# Patient Record
Sex: Female | Born: 1995 | Hispanic: No | Marital: Single | Smoking: Former smoker
Health system: Southern US, Community
[De-identification: ages and names within clinical notes are randomized; demographics above are authoritative.]

## PROBLEM LIST (undated history)

## (undated) DIAGNOSIS — F419 Anxiety disorder, unspecified: Secondary | ICD-10-CM

## (undated) DIAGNOSIS — F4310 Post-traumatic stress disorder, unspecified: Secondary | ICD-10-CM

## (undated) DIAGNOSIS — F32A Depression, unspecified: Secondary | ICD-10-CM

## (undated) DIAGNOSIS — F329 Major depressive disorder, single episode, unspecified: Secondary | ICD-10-CM

## (undated) DIAGNOSIS — N200 Calculus of kidney: Secondary | ICD-10-CM

## (undated) HISTORY — PX: WISDOM TOOTH EXTRACTION: SHX21

## (undated) HISTORY — PX: KNEE SURGERY: SHX244

## (undated) HISTORY — DX: Calculus of kidney: N20.0

---
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
## 2015-01-24 ENCOUNTER — Ambulatory Visit: Payer: Self-pay | Admitting: Emergency Medicine

## 2015-07-14 ENCOUNTER — Encounter: Payer: Self-pay | Admitting: Emergency Medicine

## 2015-07-14 ENCOUNTER — Emergency Department
Admission: EM | Admit: 2015-07-14 | Discharge: 2015-07-14 | Disposition: A | Discharge status: Home / Self Care | Payer: Self-pay | Attending: Emergency Medicine | Admitting: Emergency Medicine

## 2015-07-14 DIAGNOSIS — Z720 Tobacco use: Secondary | ICD-10-CM | POA: Insufficient documentation

## 2015-07-14 DIAGNOSIS — L0231 Cutaneous abscess of buttock: Secondary | ICD-10-CM | POA: Insufficient documentation

## 2015-07-14 HISTORY — DX: Anxiety disorder, unspecified: F41.9

## 2015-07-14 MED ORDER — KETOROLAC TROMETHAMINE 60 MG/2ML IM SOLN
60.0000 mg | Freq: Once | INTRAMUSCULAR | Status: AC
  Administered 2015-07-14: 60 mg via INTRAMUSCULAR
  Filled 2015-07-14: qty 2

## 2015-07-14 MED ORDER — SULFAMETHOXAZOLE-TRIMETHOPRIM 800-160 MG PO TABS: 1.0000 | ORAL_TABLET | Freq: Two times a day (BID) | ORAL | Status: DC

## 2015-07-14 MED ORDER — OXYCODONE-ACETAMINOPHEN 5-325 MG PO TABS: 1.0000 | ORAL_TABLET | ORAL | Status: DC | PRN

## 2015-07-14 MED ORDER — PROMETHAZINE HCL 25 MG/ML IJ SOLN
12.5000 mg | Freq: Once | INTRAMUSCULAR | Status: AC
  Administered 2015-07-14: 12.5 mg via INTRAMUSCULAR
  Filled 2015-07-14: qty 1

## 2015-07-14 NOTE — ED Notes (Signed)
Pt to ED with c/o abcess to right buttocks x 2 days

## 2015-07-14 NOTE — ED Notes (Signed)
Left buttock abscess.

## 2015-07-14 NOTE — Discharge Instructions (Signed)
Abscess  An abscess is an infected area that contains a collection of pus and debris. It can occur in almost any part of the body. An abscess is also known as a furuncle or boil.  CAUSES   An abscess occurs when tissue gets infected. This can occur from blockage of oil or sweat glands, infection of hair follicles, or a minor injury to the skin. As the body tries to fight the infection, pus collects in the area and creates pressure under the skin. This pressure causes pain. People with weakened immune systems have difficulty fighting infections and get certain abscesses more often.   SYMPTOMS  Usually an abscess develops on the skin and becomes a painful mass that is red, warm, and tender. If the abscess forms under the skin, you may feel a moveable soft area under the skin. Some abscesses break open (rupture) on their own, but most will continue to get worse without care. The infection can spread deeper into the body and eventually into the bloodstream, causing you to feel ill.   DIAGNOSIS   Your caregiver will take your medical history and perform a physical exam. A sample of fluid may also be taken from the abscess to determine what is causing your infection.  TREATMENT   Your caregiver may prescribe antibiotic medicines to fight the infection. However, taking antibiotics alone usually does not cure an abscess. Your caregiver may need to make a small cut (incision) in the abscess to drain the pus. In some cases, gauze is packed into the abscess to reduce pain and to continue draining the area.  HOME CARE INSTRUCTIONS   · Only take over-the-counter or prescription medicines for pain, discomfort, or fever as directed by your caregiver.  · If you were prescribed antibiotics, take them as directed. Finish them even if you start to feel better.  · If gauze is used, follow your caregiver's directions for changing the gauze.  · To avoid spreading the infection:  ¨ Keep your draining abscess covered with a  bandage.  ¨ Wash your hands well.  ¨ Do not share personal care items, towels, or whirlpools with others.  ¨ Avoid skin contact with others.  · Keep your skin and clothes clean around the abscess.  · Keep all follow-up appointments as directed by your caregiver.  SEEK MEDICAL CARE IF:   · You have increased pain, swelling, redness, fluid drainage, or bleeding.  · You have muscle aches, chills, or a general ill feeling.  · You have a fever.  MAKE SURE YOU:   · Understand these instructions.  · Will watch your condition.  · Will get help right away if you are not doing well or get worse.  Document Released: 08/02/2005 Document Revised: 04/23/2012 Document Reviewed: 01/05/2012  ExitCare® Patient Information ©2015 [employer]. This information is not intended to replace advice given to you by your health care provider. Make sure you discuss any questions you have with your health care provider.  MRSA Infection  MRSA stands for methicillin-resistant Staphylococcus aureus. This type of infection is caused by Staphylococcus aureus bacteria that are no longer affected by the medicines used to kill them (drug resistant). Staphylococcus (staph) bacteria are normally found on the skin or in the nose of healthy people. In most cases, these bacteria do not cause infection. But if these resistant bacteria enter your body through a cut or sore, they can cause a serious infection on your skin or in other parts of your body. There is   a slight chance that the staph on your skin or in your nose is MRSA.  There are two types of MRSA infections:  · Hospital-acquired MRSA is bacteria that you get in the hospital.  · Community-acquired MRSA is bacteria that you get somewhere other than in a hospital.  RISK FACTORS  Hospital-acquired MRSA is more common. You could be at risk for this infection if you are in the hospital and you:  · Have surgery or a procedure.  · Have an IV access or a catheter tube placed in your body.  · Have weak  resistance to germs (weakened immune system).  · Are elderly.  · Are on kidney dialysis.  You could be at risk for community-acquired MRSA if you have a break in your skin and come into contact with MRSA. This may happen if you:  · Play sports where there is skin-to-skin contact.  · Live in a crowded setting, like a dormitory or a military barracks.  · Share towels, razors, or sports equipment with other people.  SYMPTOMS   Symptoms of hospital-acquired MRSA depend on where MRSA has spread. Symptoms may include:  · Wound infection.  · Skin infection.  · Rash.  · Pneumonia.  · Fever and chills.  · Difficulty breathing.  · Chest pain.   Community-acquired MRSA is most likely to start as a scratch or cut that becomes infected. Symptoms may include:  · A pus-filled pimple.  · A boil on your skin.  · Pus draining from your skin.  · A sore (abscess) under your skin or somewhere in your body.  · Fever with or without chills.  DIAGNOSIS   The diagnosis of MRSA is made by taking a sample from an infected area and sending it to a lab for testing. A lab technician can grow (culture) MRSA and check it under a microscope. The cultured MRSA can be tested to see which type of antibiotic medicine will work to treat it. Newer tests can identify MRSA more quickly by testing bacteria samples for MRSA genes. Your health care provider can diagnose MRSA using samples from:   · Cuts or wounds in infected areas.  · Nasal swabs.  · Saliva or cough specimens from deep in the lungs (sputum).  · Urine.  · Blood.  You may also have:  · Imaging studies (such as X-ray or MRI) to check if the infection has spread to the lungs, bones, or joints.  · A culture and sensitivity test of blood or fluids from inside the joints.  TREATMENT   Treatment depends on how severe, deep, or extensive the infection is. Very bad infections may require a hospital stay.  · Some skin infections, such as a small boil or sore (abscess), may be treated by draining pus  from the site of the infection.  · More extensive surgery to drain pus may be necessary for deeper or more widespread soft tissue infections.  · You may then have to take antibiotic medicine given by mouth or through a vein. You may start antibiotic treatment right away or after testing can be done to see what antibiotic medicine should be used.  HOME CARE INSTRUCTIONS   · Take your antibiotics as directed by your health care provider. Take the medicine as prescribed until it is finished.  · Avoid close contact with those around you as much as possible. Do not use towels, razors, toothbrushes, bedding, or other items that will be used by others.  · Wash your   hands frequently for 15 seconds with soap and water. Dry your hands with a clean or disposable towel.  · When you are not able to wash your hands, use hand sanitizer that is more than 60 percent alcohol.  · Wash towels, sheets, or clothes in the washing machine with detergent and hot water. Dry them in a hot dryer.  · Follow your health care provider's instructions for wound care. Wash your hands before and after changing your bandages.  · Always shower after exercising.  · Keep all cuts and scrapes clean and covered with a bandage.  · Be sure to tell all your health care providers that you have MRSA so they are aware of your infection.  SEEK MEDICAL CARE IF:  · You have a cut, scrape, pimple, or boil that becomes red, swollen, or painful or has pus in it.  · You have pus draining from your skin.  · You have an abscess under your skin or somewhere in your body.  SEEK IMMEDIATE MEDICAL CARE IF:   · You have symptoms of a skin infection with a fever or chills.  · You have trouble breathing.  · You have chest pain.  · You have a skin wound and you become nauseous or start vomiting.  MAKE SURE YOU:  · Understand these instructions.  · Will watch your condition.  · Will get help right away if you are not doing well or get worse.  Document Released: 10/23/2005  Document Revised: 10/28/2013 Document Reviewed: 08/15/2013  ExitCare® Patient Information ©2015 [employer]. This information is not intended to replace advice given to you by your health care provider. Make sure you discuss any questions you have with your health care provider.

## 2015-07-14 NOTE — ED Provider Notes (Signed)
[facility] Emergency Department Provider Note  ____________________________________________  Time seen: Approximately 5:45 PM  I have reviewed the triage vital signs and the nursing notes.   HISTORY  Chief Complaint Recurrent Skin Infections    HPI Abigail Parker is a 19 y.o. female presents for evaluation of an abscess to her right buttocks 2 days. Patient states that she has a history of picking sores on the body secondary to anxiety issues and is concerned about infection. Past medical history significant for recent knee surgery tendon repair replacement.   Past Medical History  Diagnosis Date  . Anxiety     There are no active problems to display for this patient.   Past Surgical History  Procedure Laterality Date  . Knee surgery Left     Current Outpatient Rx  Name  Route  Sig  Dispense  Refill  . oxyCODONE-acetaminophen (ROXICET) 5-325 MG per tablet   Oral   Take 1-2 tablets by mouth every 4 (four) hours as needed for severe pain.   15 tablet   0   . sulfamethoxazole-trimethoprim (BACTRIM DS,SEPTRA DS) 800-160 MG per tablet   Oral   Take 1 tablet by mouth 2 (two) times daily.   20 tablet   0     Allergies Norco  No family history on file.  Social History Social History  Substance Use Topics  . Smoking status: Current Every Day Smoker  . Smokeless tobacco: None  . Alcohol Use: No    Review of Systems Constitutional: No fever/chills Eyes: No visual changes. ENT: No sore throat. Cardiovascular: Denies chest pain. Respiratory: Denies shortness of breath. Gastrointestinal: No abdominal pain.  No nausea, no vomiting.  No diarrhea.  No constipation. Genitourinary: Negative for dysuria. Musculoskeletal: Negative for back pain. Skin: Positive for cutaneous abscess to the right buttocks Neurological: Negative for headaches, focal weakness or numbness.  10-point ROS otherwise  negative.  ____________________________________________   PHYSICAL EXAM:  VITAL SIGNS: ED Triage Vitals  Enc Vitals Group     BP 07/14/15 1659 132/68 mmHg     Pulse Rate 07/14/15 1659 100     Resp 07/14/15 1659 18     Temp 07/14/15 1659 98.8 F (37.1 C)     Temp Source 07/14/15 1659 Oral     SpO2 07/14/15 1659 100 %     Weight 07/14/15 1659 171 lb (77.565 kg)     Height 07/14/15 1659  (1.753 m)     Head Cir --      Peak Flow --      Pain Score 07/14/15 1705 8     Pain Loc --      Pain Edu? --      Excl. in GC? --     Constitutional: Alert and oriented. Well appearing and in no acute distress. Eyes: Conjunctivae are normal. PERRL. EOMI. Head: Atraumatic. Nose: No congestion/rhinnorhea. Mouth/Throat: Mucous membranes are moist.  Oropharynx non-erythematous. Neck: No stridor.   Cardiovascular: Normal rate, regular rhythm. Grossly normal heart sounds.  Good peripheral circulation. Respiratory: Normal respiratory effort.  No retractions. Lungs CTAB. Gastrointestinal: Soft and nontender. No distention. No abdominal bruits. No CVA tenderness. Musculoskeletal: No lower extremity tenderness nor edema.  No joint effusions. Neurologic:  Normal speech and language. No gross focal neurologic deficits are appreciated. No gait instability. Skin:  Skin is warm, dry and intact. 2 cm area of erythema no fluctuance noted tender to palpation. Psychiatric: Mood and affect are normal. Speech and behavior are normal.  ____________________________________________   LABS (all labs ordered are listed, but only abnormal results are displayed)  Labs Reviewed - No data to display ____________________________________________     PROCEDURES  Procedure(s) performed: None  Critical Care performed: No  ____________________________________________   INITIAL IMPRESSION / ASSESSMENT AND PLAN / ED COURSE  Pertinent labs & imaging results that were available during my care of the patient  were reviewed by me and considered in my medical decision making (see chart for details).  Cutaneous abscess of the buttocks. Concerned about MRSA and will start empirically on Bactrim DS twice a day #20 and oxycodone as needed for pain. Patient given Toradol and Phenergan IM while in the ED. Patient voices no other emergency medical complaints at this time. ____________________________________________   FINAL CLINICAL IMPRESSION(S) / ED DIAGNOSES  Final diagnoses:  Cutaneous abscess of buttock      Evangeline Dakin, PA-C 07/14/15 1804  Darien Ramus, MD 07/14/15 2232

## 2016-04-05 ENCOUNTER — Ambulatory Visit
Admission: EM | Admit: 2016-04-05 | Discharge: 2016-04-05 | Disposition: A | Discharge status: Home / Self Care | Payer: BLUE CROSS/BLUE SHIELD | Attending: Family Medicine | Admitting: Family Medicine

## 2016-04-05 ENCOUNTER — Encounter: Payer: Self-pay | Admitting: Emergency Medicine

## 2016-04-05 DIAGNOSIS — L03114 Cellulitis of left upper limb: Secondary | ICD-10-CM | POA: Diagnosis not present

## 2016-04-05 DIAGNOSIS — L03115 Cellulitis of right lower limb: Secondary | ICD-10-CM

## 2016-04-05 MED ORDER — OXYCODONE-ACETAMINOPHEN 5-325 MG PO TABS: 1.0000 | ORAL_TABLET | Freq: Three times a day (TID) | ORAL | Status: DC | PRN

## 2016-04-05 MED ORDER — SULFAMETHOXAZOLE-TRIMETHOPRIM 800-160 MG PO TABS: 1.0000 | ORAL_TABLET | Freq: Two times a day (BID) | ORAL | Status: DC

## 2016-04-05 NOTE — Discharge Instructions (Signed)

## 2016-04-05 NOTE — ED Notes (Signed)
Patient reports history of MRSA.  Patient reports sore on her left shoulder a week ago and right buttock for 2 days.  Patient reports to picking at her sores due to anxiety.  Patient denies fevers.

## 2016-05-04 ENCOUNTER — Emergency Department: Payer: BLUE CROSS/BLUE SHIELD

## 2016-05-04 ENCOUNTER — Emergency Department
Admission: EM | Admit: 2016-05-04 | Discharge: 2016-05-05 | Disposition: A | Discharge status: Home / Self Care | Payer: BLUE CROSS/BLUE SHIELD | Attending: Emergency Medicine | Admitting: Emergency Medicine

## 2016-05-04 ENCOUNTER — Ambulatory Visit
Admission: EM | Admit: 2016-05-04 | Discharge: 2016-05-04 | Disposition: A | Discharge status: Other Acute Inpatient Hospital | Payer: BLUE CROSS/BLUE SHIELD | Attending: Family Medicine | Admitting: Family Medicine

## 2016-05-04 ENCOUNTER — Encounter: Payer: Self-pay | Admitting: *Deleted

## 2016-05-04 DIAGNOSIS — N200 Calculus of kidney: Secondary | ICD-10-CM | POA: Diagnosis not present

## 2016-05-04 DIAGNOSIS — F329 Major depressive disorder, single episode, unspecified: Secondary | ICD-10-CM | POA: Insufficient documentation

## 2016-05-04 DIAGNOSIS — F17200 Nicotine dependence, unspecified, uncomplicated: Secondary | ICD-10-CM | POA: Insufficient documentation

## 2016-05-04 DIAGNOSIS — R109 Unspecified abdominal pain: Secondary | ICD-10-CM | POA: Diagnosis present

## 2016-05-04 DIAGNOSIS — Z79899 Other long term (current) drug therapy: Secondary | ICD-10-CM | POA: Insufficient documentation

## 2016-05-04 DIAGNOSIS — R1012 Left upper quadrant pain: Secondary | ICD-10-CM

## 2016-05-04 DIAGNOSIS — Z87442 Personal history of urinary calculi: Secondary | ICD-10-CM | POA: Diagnosis not present

## 2016-05-04 DIAGNOSIS — R319 Hematuria, unspecified: Secondary | ICD-10-CM

## 2016-05-04 DIAGNOSIS — J069 Acute upper respiratory infection, unspecified: Secondary | ICD-10-CM | POA: Diagnosis not present

## 2016-05-04 HISTORY — DX: Major depressive disorder, single episode, unspecified: F32.9

## 2016-05-04 HISTORY — DX: Post-traumatic stress disorder, unspecified: F43.10

## 2016-05-04 HISTORY — DX: Depression, unspecified: F32.A

## 2016-05-04 LAB — URINALYSIS COMPLETE WITH MICROSCOPIC (ARMC ONLY)
Bacteria, UA: NONE SEEN
Bilirubin Urine: NEGATIVE
GLUCOSE, UA: NEGATIVE mg/dL
Glucose, UA: NEGATIVE mg/dL
Ketones, ur: NEGATIVE mg/dL
LEUKOCYTES UA: NEGATIVE
NITRITE: NEGATIVE
Nitrite: NEGATIVE
PH: 7 (ref 5.0–8.0)
PROTEIN: 100 mg/dL — AB
PROTEIN: 100 mg/dL — AB
SPECIFIC GRAVITY, URINE: 1.02 (ref 1.005–1.030)
Specific Gravity, Urine: 1.018 (ref 1.005–1.030)
pH: 7 (ref 5.0–8.0)

## 2016-05-04 LAB — BASIC METABOLIC PANEL
Anion gap: 7 (ref 5–15)
BUN: 10 mg/dL (ref 6–20)
CHLORIDE: 105 mmol/L (ref 101–111)
CO2: 24 mmol/L (ref 22–32)
CREATININE: 0.64 mg/dL (ref 0.44–1.00)
Calcium: 9.3 mg/dL (ref 8.9–10.3)
GFR calc Af Amer: >60 mL/min (ref >60)
GLUCOSE: 89 mg/dL (ref 65–99)
POTASSIUM: 3.4 mmol/L — AB (ref 3.5–5.1)
Sodium: 136 mmol/L (ref 135–145)

## 2016-05-04 LAB — CBC
HCT: 39.3 % (ref 35.0–47.0)
Hemoglobin: 13.7 g/dL (ref 12.0–16.0)
MCH: 32.1 pg (ref 26.0–34.0)
MCHC: 34.8 g/dL (ref 32.0–36.0)
MCV: 92.2 fL (ref 80.0–100.0)
PLATELETS: 211 10*3/uL (ref 150–440)
RBC: 4.26 MIL/uL (ref 3.80–5.20)
RDW: 13.1 % (ref 11.5–14.5)
WBC: 10.3 10*3/uL (ref 3.6–11.0)

## 2016-05-04 LAB — POCT PREGNANCY, URINE: PREG TEST UR: NEGATIVE

## 2016-05-04 LAB — RAPID STREP SCREEN (MED CTR MEBANE ONLY): Streptococcus, Group A Screen (Direct): NEGATIVE

## 2016-05-04 MED ORDER — FENTANYL CITRATE (PF) 100 MCG/2ML IJ SOLN
50.0000 ug | Freq: Once | INTRAMUSCULAR | Status: AC
  Administered 2016-05-04: 50 ug via INTRAVENOUS

## 2016-05-04 MED ORDER — KETOROLAC TROMETHAMINE 60 MG/2ML IM SOLN
60.0000 mg | Freq: Once | INTRAMUSCULAR | Status: AC
  Administered 2016-05-04: 60 mg via INTRAMUSCULAR

## 2016-05-04 MED ORDER — TAMSULOSIN HCL 0.4 MG PO CAPS: 0.4000 mg | ORAL_CAPSULE | Freq: Every day | ORAL | Status: DC

## 2016-05-04 MED ORDER — ONDANSETRON HCL 4 MG/2ML IJ SOLN
4.0000 mg | Freq: Once | INTRAMUSCULAR | Status: AC
  Administered 2016-05-04: 4 mg via INTRAVENOUS
  Filled 2016-05-04: qty 2

## 2016-05-04 MED ORDER — OXYCODONE HCL 5 MG PO TABS: 5.0000 mg | ORAL_TABLET | Freq: Three times a day (TID) | ORAL | Status: AC | PRN

## 2016-05-04 MED ORDER — SODIUM CHLORIDE 0.9 % IV BOLUS (SEPSIS)
1000.0000 mL | Freq: Once | INTRAVENOUS | Status: AC
  Administered 2016-05-04: 1000 mL via INTRAVENOUS

## 2016-05-04 MED ORDER — FENTANYL CITRATE (PF) 100 MCG/2ML IJ SOLN
INTRAMUSCULAR | Status: AC
  Administered 2016-05-04: 50 ug via INTRAVENOUS
  Filled 2016-05-04: qty 2

## 2016-05-04 NOTE — ED Provider Notes (Addendum)
[facility] Emergency Department Provider Note   ____________________________________________  Time seen: Approximately 1010PM  I have reviewed the triage vital signs and the nursing notes.   HISTORY  Chief Complaint Flank Pain   HPI Abigail Parker is a 20 y.o. female with a history of kidney stones is presenting with left flank pain over the past 3 days which is worsening. She describes the pain as sharp and radiating around to the front of her abdomen. She denies any nausea vomiting or diarrhea. Was seen in urgent care earlier tonight and given 60 mg of Toradol but says that did not relieve her pain. She says she has never needed a surgical procedure to remove the stones in the past. Does not have a urologist.Has noticed blood in her urine.   Past Medical History  Diagnosis Date  . Anxiety   . Depression   . PTSD (post-traumatic stress disorder)     There are no active problems to display for this patient.   Past Surgical History  Procedure Laterality Date  . Knee surgery Left     Current Outpatient Rx  Name  Route  Sig  Dispense  Refill  . clonazePAM (KLONOPIN) 1 MG tablet   Oral   Take 1 mg by mouth 3 (three) times daily as needed for anxiety.         . divalproex (DEPAKOTE) 250 MG DR tablet   Oral   Take 250 mg by mouth 2 (two) times daily.         . DULoxetine (CYMBALTA) 60 MG capsule   Oral   Take 60 mg by mouth daily.           Allergies Norco  Family History  Problem Relation Age of Onset  . Hypertension Mother   . Depression Mother   . Anxiety disorder Mother     Social History Social History  Substance Use Topics  . Smoking status: Current Some Day Smoker  . Smokeless tobacco: Never Used  . Alcohol Use: No    Review of Systems Constitutional: No fever/chills Eyes: No visual changes. ENT: No sore throat. Cardiovascular: Denies chest pain. Respiratory: Denies shortness of breath. Gastrointestinal:    No nausea, no vomiting.  No diarrhea.  No constipation. Genitourinary: Negative for dysuria. Musculoskeletal: Left-sided flank and back pain. Skin: Negative for rash. Neurological: Negative for headaches, focal weakness or numbness.  10-point ROS otherwise negative.  ____________________________________________   PHYSICAL EXAM:  VITAL SIGNS: ED Triage Vitals  Enc Vitals Group     BP 05/04/16 2047 145/75 mmHg     Pulse Rate 05/04/16 2047 63     Resp 05/04/16 2047 18     Temp 05/04/16 2047 98.5 F (36.9 C)     Temp Source 05/04/16 2047 Oral     SpO2 05/04/16 2047 100 %     Weight 05/04/16 2047 180 lb (81.647 kg)     Height 05/04/16 2047 5\' 9"  (1.753 m)     Head Cir --      Peak Flow --      Pain Score 05/04/16 2048 9     Pain Loc --      Pain Edu? --      Excl. in GC? --     Constitutional: Alert and oriented. in no acute distress.Lying on her right side in the bed. Eyes: Conjunctivae are normal. PERRL. EOMI. Head: Atraumatic. Nose: No congestion/rhinnorhea. Mouth/Throat: Mucous membranes are moist.   Neck: No stridor.  Cardiovascular: Normal rate, regular rhythm. Grossly normal heart sounds.  Respiratory: Normal respiratory effort.  No retractions. Lungs CTAB. Gastrointestinal: Soft with moderate tenderness to the left abdomen lateral to the umbilicus. No rebound or guarding. No distention.   Becomes tearful upon palpation. Also positive for left-sided CVA tenderness palpation. Musculoskeletal: No lower extremity tenderness nor edema.  No joint effusions. Neurologic:  Normal speech and language. No gross focal neurologic deficits are appreciated. No gait instability. Skin:  Skin is warm, dry and intact. No rash noted. Psychiatric: Mood and affect are normal. Speech and behavior are normal.  ____________________________________________   LABS (all labs ordered are listed, but only abnormal results are displayed)  Labs Reviewed  BASIC METABOLIC PANEL - Abnormal;  Notable for the following:    Potassium 3.4 (*)    All other components within normal limits  URINALYSIS COMPLETEWITH MICROSCOPIC ([facility] ONLY) - Abnormal; Notable for the following:    Color, Urine RED (*)    APPearance TURBID (*)    Hgb urine dipstick 3+ (*)    Protein, ur 100 (*)    Leukocytes, UA 2+ (*)    Bacteria, UA MANY (*)    Squamous Epithelial / LPF TOO NUMEROUS TO COUNT (*)    All other components within normal limits  CBC  POC URINE PREG, ED  POCT PREGNANCY, URINE   ____________________________________________  EKG   ____________________________________________  RADIOLOGY   ____________________________________________   PROCEDURES   ____________________________________________   INITIAL IMPRESSION / ASSESSMENT AND PLAN / ED COURSE  Pertinent labs & imaging results that were available during my care of the patient were reviewed by me and considered in my medical decision making (see chart for details).  Urine shows contamination here with squamous epithelial cells. However earlier in the evening the patient had another urinalysis done at the urgent care which showed no leukocyte esterase or white blood cells or nitrite. Was positive for blood. Do not think that the patient has an infected urine.  ----------------------------------------- 11:17 PM on 05/04/2016 -----------------------------------------  Signed out to Dr. Manson PasseyBrown. Pending ultrasound results. ____________________________________________   FINAL CLINICAL IMPRESSION(S) / ED DIAGNOSES  Kidney stone.    NEW MEDICATIONS STARTED DURING THIS VISIT:  New Prescriptions   No medications on file     Note:  This document was prepared using Dragon voice recognition software and may include unintentional dictation errors.    Myrna Blazeravid Matthew Brantley Naser, MD 05/04/16 2317  Patient is a Norco allergy but says she has tolerated oxycodone in the past.  Myrna Blazeravid Matthew Jiali Linney, MD 05/04/16  2318

## 2016-05-04 NOTE — Discharge Instructions (Signed)
Cool Mist Vaporizers Vaporizers may help relieve the symptoms of a cough and cold. They add moisture to the air, which helps mucus to become thinner and less sticky. This makes it easier to breathe and cough up secretions. Cool mist vaporizers do not cause serious burns like hot mist vaporizers, which may also be called steamers or humidifiers. Vaporizers have not been proven to help with colds. You should not use a vaporizer if you are allergic to mold. HOME CARE INSTRUCTIONS  Follow the package instructions for the vaporizer.  Do not use anything other than distilled water in the vaporizer.  Do not run the vaporizer all of the time. This can cause mold or bacteria to grow in the vaporizer.  Clean the vaporizer after each time it is used.  Clean and dry the vaporizer well before storing it.  Stop using the vaporizer if worsening respiratory symptoms develop.   This information is not intended to replace advice given to you by your health care provider. Make sure you discuss any questions you have with your health care provider.   Document Released: 07/20/2004 Document Revised: 10/28/2013 Document Reviewed: 03/12/2013 Elsevier Interactive Patient Education 2016 Elsevier Inc.  Flank Pain Flank pain refers to pain that is located on the side of the body between the upper abdomen and the back. The pain may occur over a short period of time (acute) or may be long-term or reoccurring (chronic). It may be mild or severe. Flank pain can be caused by many things. CAUSES  Some of the more common causes of flank pain include:  Muscle strains.   Muscle spasms.   A disease of your spine (vertebral disk disease).   A lung infection (pneumonia).   Fluid around your lungs (pulmonary edema).   A kidney infection.   Kidney stones.   A very painful skin rash caused by the chickenpox virus (shingles).   Gallbladder disease.  HOME CARE INSTRUCTIONS  Home care will depend on the  cause of your pain. In general,  Rest as directed by your caregiver.  Drink enough fluids to keep your urine clear or pale yellow.  Only take over-the-counter or prescription medicines as directed by your caregiver. Some medicines may help relieve the pain.  Tell your caregiver about any changes in your pain.  Follow up with your caregiver as directed. SEEK IMMEDIATE MEDICAL CARE IF:   Your pain is not controlled with medicine.   You have new or worsening symptoms.  Your pain increases.   You have abdominal pain.   You have shortness of breath.   You have persistent nausea or vomiting.   You have swelling in your abdomen.   You feel faint or pass out.   You have blood in your urine.  You have a fever or persistent symptoms for more than 2-3 days.  You have a fever and your symptoms suddenly get worse. MAKE SURE YOU:   Understand these instructions.  Will watch your condition.  Will get help right away if you are not doing well or get worse.   This information is not intended to replace advice given to you by your health care provider. Make sure you discuss any questions you have with your health care provider.   Document Released: 12/14/2005 Document Revised: 07/17/2012 Document Reviewed: 06/06/2012 Elsevier Interactive Patient Education 2016 Elsevier Inc.  Upper Respiratory Infection, Adult Most upper respiratory infections (URIs) are a viral infection of the air passages leading to the lungs. A URI affects the  nose, throat, and upper air passages. The most common type of URI is nasopharyngitis and is typically referred to as "the common cold." URIs run their course and usually go away on their own. Most of the time, a URI does not require medical attention, but sometimes a bacterial infection in the upper airways can follow a viral infection. This is called a secondary infection. Sinus and middle ear infections are common types of secondary upper respiratory  infections. Bacterial pneumonia can also complicate a URI. A URI can worsen asthma and chronic obstructive pulmonary disease (COPD). Sometimes, these complications can require emergency medical care and may be life threatening.  CAUSES Almost all URIs are caused by viruses. A virus is a type of germ and can spread from one person to another.  RISKS FACTORS You may be at risk for a URI if:   You smoke.   You have chronic heart or lung disease.  You have a weakened defense (immune) system.   You are very young or very old.   You have nasal allergies or asthma.  You work in crowded or poorly ventilated areas.  You work in health care facilities or schools. SIGNS AND SYMPTOMS  Symptoms typically develop 2-3 days after you come in contact with a cold virus. Most viral URIs last 7-10 days. However, viral URIs from the influenza virus (flu virus) can last 14-18 days and are typically more severe. Symptoms may include:   Runny or stuffy (congested) nose.   Sneezing.   Cough.   Sore throat.   Headache.   Fatigue.   Fever.   Loss of appetite.   Pain in your forehead, behind your eyes, and over your cheekbones (sinus pain).  Muscle aches.  DIAGNOSIS  Your health care provider may diagnose a URI by:  Physical exam.  Tests to check that your symptoms are not due to another condition such as:  Strep throat.  Sinusitis.  Pneumonia.  Asthma. TREATMENT  A URI goes away on its own with time. It cannot be cured with medicines, but medicines may be prescribed or recommended to relieve symptoms. Medicines may help:  Reduce your fever.  Reduce your cough.  Relieve nasal congestion. HOME CARE INSTRUCTIONS   Take medicines only as directed by your health care provider.   Gargle warm saltwater or take cough drops to comfort your throat as directed by your health care provider.  Use a warm mist humidifier or inhale steam from a shower to increase air moisture.  This may make it easier to breathe.  Drink enough fluid to keep your urine clear or pale yellow.   Eat soups and other clear broths and maintain good nutrition.   Rest as needed.   Return to work when your temperature has returned to normal or as your health care provider advises. You may need to stay home longer to avoid infecting others. You can also use a face mask and careful hand washing to prevent spread of the virus.  Increase the usage of your inhaler if you have asthma.   Do not use any tobacco products, including cigarettes, chewing tobacco, or electronic cigarettes. If you need help quitting, ask your health care provider. PREVENTION  The best way to protect yourself from getting a cold is to practice good hygiene.   Avoid oral or hand contact with people with cold symptoms.   Wash your hands often if contact occurs.  There is no clear evidence that vitamin C, vitamin E, echinacea, or exercise  reduces the chance of developing a cold. However, it is always recommended to get plenty of rest, exercise, and practice good nutrition.  SEEK MEDICAL CARE IF:   You are getting worse rather than better.   Your symptoms are not controlled by medicine.   You have chills.  You have worsening shortness of breath.  You have brown or red mucus.  You have yellow or brown nasal discharge.  You have pain in your face, especially when you bend forward.  You have a fever.  You have swollen neck glands.  You have pain while swallowing.  You have white areas in the back of your throat. SEEK IMMEDIATE MEDICAL CARE IF:   You have severe or persistent:  Headache.  Ear pain.  Sinus pain.  Chest pain.  You have chronic lung disease and any of the following:  Wheezing.  Prolonged cough.  Coughing up blood.  A change in your usual mucus.  You have a stiff neck.  You have changes in your:  Vision.  Hearing.  Thinking.  Mood. MAKE SURE YOU:    Understand these instructions.  Will watch your condition.  Will get help right away if you are not doing well or get worse.   This information is not intended to replace advice given to you by your health care provider. Make sure you discuss any questions you have with your health care provider.   Document Released: 04/18/2001 Document Revised: 03/09/2015 Document Reviewed: 01/28/2014 Elsevier Interactive Patient Education [employer]2016 Elsevier Inc.

## 2016-05-04 NOTE — ED Notes (Signed)
Patient started having left flank pain 3 days ago and nasal congestion with cough 2 days ago. Patient states that she is peeing blood. She reports having a history of kidney stones.

## 2016-05-04 NOTE — ED Notes (Signed)
Pt in with co left flank pain x 3 days states has had bloody urine and foul smelling urine. [facility] and sent here due to blood in urine sample.

## 2016-05-04 NOTE — ED Notes (Signed)
Pt in via triage with complaints of intermittent left flank pain x 3 days.  Pt reports being seen at [facility] and sent here due to blood in her urine.  Pt reports urgency as well as difficulty with urination at times.  Pt A/Ox4, in no immediate distress at this time.

## 2016-05-04 NOTE — ED Provider Notes (Signed)
CSN: [id number]     Arrival date & time 05/04/16  1842 History   First MD Initiated Contact with Patient 05/04/16 1922     Chief Complaint  Patient presents with  . Flank Pain   (Consider location/radiation/quality/duration/timing/severity/associated sxs/prior Treatment) HPI  20 year old female presents with left flank pain for 3 days and also nasal congestion with cough for 2 days. Has noticed red urine along with significant left flank pain. She has a previous history of kidney stones one that she was seen in the emergency room and a a second stone she passed spontaneously. She is not on her period has Marana IUD but will have occasional vaginal bleeding. She thinks that most of this bleeding is from the urethra. No fever or chills. Slight amount of whitish discharge vaginally.      Past Medical History  Diagnosis Date  . Anxiety   . Depression   . PTSD (post-traumatic stress disorder)    Past Surgical History  Procedure Laterality Date  . Knee surgery Left    Family History  Problem Relation Age of Onset  . Hypertension Mother   . Depression Mother   . Anxiety disorder Mother    Social History  Substance Use Topics  . Smoking status: Current Some Day Smoker  . Smokeless tobacco: Never Used  . Alcohol Use: No   OB History    No data available     Review of Systems  Constitutional: Positive for activity change. Negative for fever, chills and fatigue.  HENT: Positive for congestion, postnasal drip, sinus pressure and sore throat.   Respiratory: Positive for cough. Negative for shortness of breath, wheezing and stridor.   Genitourinary: Positive for dysuria, urgency, frequency, hematuria, flank pain and vaginal discharge.  All other systems reviewed and are negative.   Allergies  Norco  Home Medications   Prior to Admission medications   Medication Sig Start Date End Date Taking? Authorizing Provider  clonazePAM (KLONOPIN) 1 MG tablet Take 1 mg by mouth 3  (three) times daily as needed for anxiety.   Yes Historical Provider, MD  divalproex (DEPAKOTE) 250 MG DR tablet Take 250 mg by mouth 2 (two) times daily.   Yes Historical Provider, MD  DULoxetine (CYMBALTA) 60 MG capsule Take 60 mg by mouth daily.   Yes Historical Provider, MD   Meds Ordered and Administered this Visit   Medications  ketorolac (TORADOL) injection 60 mg (60 mg Intramuscular Given 05/04/16 1941)    BP 128/69 mmHg  Pulse 75  Temp(Src) 98.7 F (37.1 C) (Oral)  Resp 18  Ht 5' 9.75" (1.772 m)  Wt 180 lb (81.647 kg)  BMI 26.00 kg/m2  SpO2 100%  LMP 03/24/2016 No data found.   Physical Exam  Constitutional: She is oriented to person, place, and time. She appears well-developed and well-nourished. No distress.  HENT:  Head: Normocephalic and atraumatic.  Right Ear: External ear normal.  Left Ear: External ear normal.  Nose: Nose normal.  Mouth/Throat: Oropharynx is clear and moist. No oropharyngeal exudate.  Eyes: Conjunctivae are normal. Pupils are equal, round, and reactive to light. Right eye exhibits no discharge. Left eye exhibits no discharge.  Neck: Normal range of motion. Neck supple.  Pulmonary/Chest: Effort normal and breath sounds normal. No respiratory distress. She has no wheezes. She has no rales.  Abdominal: Soft. Bowel sounds are normal. She exhibits no distension. There is tenderness. There is no rebound and no guarding.  He has significant left CVA tenderness.  Musculoskeletal: Normal  range of motion. She exhibits no edema or tenderness.  Lymphadenopathy:    She has no cervical adenopathy.  Neurological: She is alert and oriented to person, place, and time.  Skin: Skin is warm and dry. She is not diaphoretic.  Psychiatric: She has a normal mood and affect. Her behavior is normal. Judgment and thought content normal.  Nursing note and vitals reviewed.   ED Course  Procedures (including critical care time)  Labs Review Labs Reviewed   URINALYSIS COMPLETEWITH MICROSCOPIC ([facility] ONLY) - Abnormal; Notable for the following:    Color, Urine RED (*)    APPearance CLOUDY (*)    Bilirubin Urine SMALL (*)    Ketones, ur TRACE (*)    Hgb urine dipstick LARGE (*)    Protein, ur 100 (*)    Squamous Epithelial / LPF 0-5 (*)    All other components within normal limits  RAPID STREP SCREEN (NOT AT [facility])  CULTURE, GROUP A STREP [facility]    Imaging Review No results found.   Visual Acuity Review  Right Eye Distance:   Left Eye Distance:   Bilateral Distance:    Right Eye Near:   Left Eye Near:    Bilateral Near:     Medications  ketorolac (TORADOL) injection 60 mg (60 mg Intramuscular Given 05/04/16 1941)      MDM   1. Acute URI   2. Acute left flank pain   3. History of kidney stones    Discharge Medication List as of 05/04/2016  7:54 PM    Plan: 1. Test/x-ray results and diagnosis reviewed with patient 2. rx as per orders; risks, benefits, potential side effects reviewed with patient 3. Recommend supportive treatment with Flonase for her nasal congestion and upper respiratory symptoms. Take lozenges or cough drops as necessary. Is is probably viral rash course.Because of her too numerous to count RBC her left flank pain and our not having a CAT scan available to us tonight recommended she be seen at the emergency room for further evaluation and treatment possibly a stone run to confirm a kidney stone. Is agreeable to this. I did provide her with Toradol injection because of the pain prior to her departure. [facility] She was stable at the time of her departure . 4. F/u prn if symptoms worsen or don't improve  Should be noted this patient has another chart from previous visits that are not appearing today. It is in the process of being merged. EM medical record number for the chart is [id number].   Lutricia FeilWilliam P Jojuan Champney, PA-C 05/04/16 2008

## 2016-05-05 ENCOUNTER — Emergency Department: Payer: BLUE CROSS/BLUE SHIELD

## 2016-05-05 MED ORDER — OXYCODONE-ACETAMINOPHEN 5-325 MG PO TABS: 1.0000 | ORAL_TABLET | Freq: Once | ORAL | Status: DC

## 2016-05-05 NOTE — ED Notes (Signed)
Pt discharged to home.  Friends driving.  Discharge instructions reviewed.  Verbalized understanding.  No questions or concerns at this time.  Teach back verified.  Pt in NAD.  No items left in ED.

## 2016-05-05 NOTE — ED Notes (Signed)
EDP aware of BP.  Pt states this is her normal BP.  EDP acknowledged.

## 2016-05-05 NOTE — ED Provider Notes (Signed)
I assumed care from Dr Langston MaskerShaevitz US/CT revealed:    CT Renal Stone Study (Final result) Result time: 05/05/16 01:48:12   Final result by Rad Results In Interface (05/05/16 01:48:12)   Narrative:   CLINICAL DATA: 20 year old female with abdominal pain and hematuria  EXAM: CT ABDOMEN AND PELVIS WITHOUT CONTRAST  TECHNIQUE: Multidetector CT imaging of the abdomen and pelvis was performed following the standard protocol without IV contrast.  COMPARISON: CT dated 08/27/2014 and renal ultrasound dated 05/04/2016  FINDINGS: Evaluation of this exam is limited in the absence of intravenous contrast.  The visualized lung bases are clear. No intra-abdominal free air or free fluid.  Stop mild fatty infiltration of the liver. The gallbladder, pancreas, spleen, adrenal glands, kidneys, visualized ureters, and urinary bladder appear unremarkable. The uterus is anteverted and grossly unremarkable. An intrauterine device is noted.  The ovaries are grossly unremarkable.  Constipation. No bowel obstruction or active inflammation. Normal appendix.  The abdominal aorta and IVC appear grossly unremarkable on this noncontrast study. No portal venous gas identified. There is no adenopathy. There is mild diffuse subcutaneous stranding. No fluid collection. The osseous structures are intact.  IMPRESSION: No acute intra-abdominal or pelvic pathology. Specifically there is no hydronephrosis or nephrolithiasis.  Constipation. No bowel obstruction. Normal appendix.  Mild fatty liver.   Electronically Signed By: Elgie CollardArash Radparvar M.D. On: 05/05/2016 01:48          US Renal (Final result) Result time: 05/05/16 00:53:54   Final result by Rad Results In Interface (05/05/16 00:53:54)   Narrative:   CLINICAL DATA: Left flank pain for 3 days  EXAM: RENAL / URINARY TRACT ULTRASOUND COMPLETE  COMPARISON: None.  FINDINGS: Right Kidney:  Length: 12.3 cm. Echogenicity  within normal limits. No mass or hydronephrosis visualized.  Left Kidney:  Length: 11.7 cm. Echogenicity within normal limits. No mass or hydronephrosis visualized.  Bladder:  Empty.  IMPRESSION: Normal kidneys. No hydronephrosis.   Electronically Signed By: Ellery Plunkaniel R Mitchell M.D. On: 05/05/2016 00:53        ECG Results      Darci Currentandolph N Serah Nicoletti, MD 05/05/16 [id number]

## 2016-05-05 NOTE — Discharge Instructions (Signed)

## 2016-05-07 LAB — CULTURE, GROUP A STREP (THRC)

## 2016-05-10 ENCOUNTER — Encounter: Payer: Self-pay | Admitting: *Deleted

## 2016-05-12 ENCOUNTER — Encounter: Payer: Self-pay | Admitting: Emergency Medicine

## 2016-05-12 ENCOUNTER — Ambulatory Visit (INDEPENDENT_AMBULATORY_CARE_PROVIDER_SITE_OTHER): Payer: BLUE CROSS/BLUE SHIELD

## 2016-05-12 ENCOUNTER — Ambulatory Visit
Admission: EM | Admit: 2016-05-12 | Discharge: 2016-05-12 | Disposition: A | Discharge status: Home / Self Care | Payer: BLUE CROSS/BLUE SHIELD | Attending: Family Medicine | Admitting: Family Medicine

## 2016-05-12 DIAGNOSIS — S62318A Displaced fracture of base of other metacarpal bone, initial encounter for closed fracture: Secondary | ICD-10-CM

## 2016-05-12 MED ORDER — OXYCODONE-ACETAMINOPHEN 5-325 MG PO TABS: 1.0000 | ORAL_TABLET | Freq: Three times a day (TID) | ORAL | Status: DC | PRN

## 2016-05-12 MED ORDER — KETOROLAC TROMETHAMINE 60 MG/2ML IM SOLN
60.0000 mg | Freq: Once | INTRAMUSCULAR | Status: AC
  Administered 2016-05-12: 60 mg via INTRAMUSCULAR

## 2016-05-12 MED ORDER — ONDANSETRON 8 MG PO TBDP
8.0000 mg | ORAL_TABLET | Freq: Once | ORAL | Status: AC
  Administered 2016-05-12: 8 mg via ORAL

## 2016-05-12 NOTE — ED Notes (Addendum)
Patient states that she tripped over a cord and she heard a pop in her left 5th finger.  Patient reports pain going up her right arm.

## 2016-05-12 NOTE — ED Provider Notes (Signed)
CSN: [id number]     Arrival date & time 05/12/16  1412 History   None    Chief Complaint  Patient presents with  . Finger Injury   (Consider location/radiation/quality/duration/timing/severity/associated sxs/prior Treatment) HPI Comments: 20 yo female presents with a complaint of left hand, forearm and upper arm pain after tripping over a cord and hitting a doorsill. States pain is worse over the left 5th finger.   The history is provided by the patient.    Past Medical History  Diagnosis Date  . Anxiety   . Depression   . PTSD (post-traumatic stress disorder)    Past Surgical History  Procedure Laterality Date  . Knee surgery Left    Family History  Problem Relation Age of Onset  . Hypertension Mother   . Depression Mother   . Anxiety disorder Mother    Social History  Substance Use Topics  . Smoking status: Current Some Day Smoker  . Smokeless tobacco: Never Used  . Alcohol Use: No   OB History    Gravida Para Term Preterm AB TAB SAB Ectopic Multiple Living   0 0 0 0 0 0 0 0       Review of Systems  Allergies  Norco and Norco  Home Medications   Prior to Admission medications   Medication Sig Start Date End Date Taking? Authorizing Provider  clonazePAM (KLONOPIN) 1 MG tablet Take 1 mg by mouth 2 (two) times daily as needed for anxiety.    Historical Provider, MD  clonazePAM (KLONOPIN) 1 MG tablet Take 1 mg by mouth 3 (three) times daily as needed for anxiety.    Historical Provider, MD  divalproex (DEPAKOTE) 125 MG DR tablet Take 125 mg by mouth 2 (two) times daily.    Historical Provider, MD  divalproex (DEPAKOTE) 250 MG DR tablet Take 250 mg by mouth 2 (two) times daily.    Historical Provider, MD  DULoxetine (CYMBALTA) 60 MG capsule Take 60 mg by mouth daily.    Historical Provider, MD  DULoxetine (CYMBALTA) 60 MG capsule Take 60 mg by mouth daily.    Historical Provider, MD  oxyCODONE (ROXICODONE) 5 MG immediate release tablet Take 1 tablet (5 mg total) by  mouth every 8 (eight) hours as needed. 05/04/16 05/04/17  Myrna Blazeravid Matthew Schaevitz, MD  oxyCODONE-acetaminophen (PERCOCET/ROXICET) 5-325 MG tablet Take 1-2 tablets by mouth every 8 (eight) hours as needed for severe pain. 05/12/16   Payton Mccallumrlando Takerra Lupinacci, MD  sulfamethoxazole-trimethoprim (BACTRIM DS,SEPTRA DS) 800-160 MG tablet Take 1 tablet by mouth 2 (two) times daily. 04/05/16   Payton Mccallumrlando Dontrell Stuck, MD  tamsulosin (FLOMAX) 0.4 MG CAPS capsule Take 1 capsule (0.4 mg total) by mouth daily. 05/04/16   Myrna Blazeravid Matthew Schaevitz, MD   Meds Ordered and Administered this Visit   Medications  ketorolac (TORADOL) injection 60 mg (60 mg Intramuscular Given 05/12/16 1451)  ondansetron (ZOFRAN-ODT) disintegrating tablet 8 mg (8 mg Oral Given 05/12/16 1450)    BP 136/67 mmHg  Pulse 67  Temp(Src) 98.1 F (36.7 C) (Oral)  Resp 16  Ht 5\' 9"  (1.753 m)  Wt 180 lb (81.647 kg)  BMI 26.57 kg/m2  SpO2 99%  LMP 03/24/2016 No data found.   Physical Exam  Constitutional: She appears well-developed and well-nourished. No distress.  Musculoskeletal:       Left hand: She exhibits bony tenderness (over 5th metacarpal). She exhibits normal range of motion, normal two-point discrimination, normal capillary refill, no deformity and no laceration. Normal sensation noted.  Tenderness to palpation  over left forearm and upper arm (humerus)  Skin: She is not diaphoretic.  Nursing note and vitals reviewed.   ED Course  Procedures (including critical care time)  Labs Review Labs Reviewed - No data to display  Imaging Review Dg Forearm Left  05/12/2016  CLINICAL DATA:  Pain following fall EXAM: LEFT FOREARM - 2 VIEW COMPARISON:  None. FINDINGS: Frontal and lateral views obtained. There is no fracture or dislocation. Joint spaces appear normal. No abnormal periosteal reaction. Incidental note is made of a minus ulnar variance. IMPRESSION: No fracture or dislocation. No apparent arthropathy. Minus ulnar variance. Electronically Signed    By: Bretta BangWilliam  Woodruff III M.D.   On: 05/12/2016 15:41   Dg Humerus Left  05/12/2016  CLINICAL DATA:  Pain following fall EXAM: LEFT HUMERUS - 2+ VIEW COMPARISON:  None. FINDINGS: Frontal and lateral views obtained. No fracture or dislocation. Joint spaces appear normal. No erosive change. No abnormal periosteal reaction. IMPRESSION: No fracture or dislocation.  No apparent arthropathy. Electronically Signed   By: Bretta BangWilliam  Woodruff III M.D.   On: 05/12/2016 15:41   Dg Hand Complete Left  05/12/2016  CLINICAL DATA:  Fall on wall with left hand pain, initial encounter EXAM: LEFT HAND - COMPLETE 3+ VIEW COMPARISON:  None. FINDINGS: There is an oblique fracture through the midshaft of the fifth metacarpal with mild impaction at the fracture site. No other fractures are seen. IMPRESSION: Fifth metacarpal fracture Electronically Signed   By: Alcide CleverMark  Lukens M.D.   On: 05/12/2016 15:42     Visual Acuity Review  Right Eye Distance:   Left Eye Distance:   Bilateral Distance:    Right Eye Near:   Left Eye Near:    Bilateral Near:         MDM   1. Fracture of fifth metacarpal bone, closed, initial encounter   (left)    Discharge Medication List as of 05/12/2016  4:06 PM     1. x-ray results and diagnosis reviewed with patient 2. Area immobilized with an ulnar gutter splint 3. rx as per orders above; reviewed possible side effects, interactions, risks and benefits;rx printed for percocet as per orders  4. Recommend supportive treatment with elevation 5. Follow-up with orthopedist within 1 week  Payton Mccallumrlando Cheril Slattery, MD 05/12/16 [phone]

## 2016-05-12 NOTE — Discharge Instructions (Signed)
Boxer's Fracture °A boxer's fracture is a break (fracture) of the bone in your hand that connects your little finger to your wrist (fifth metacarpal). This type of fracture usually happens at the end of the bone, closest to the little finger. The knuckle is often pushed down by the impact. °In some cases, only a splint or brace is needed, or you may need a cast. Casting or splinting may include taping your injured finger to the next finger (buddy taping). You may need surgery to repair the fracture. This may involve the use of wires, screws, or plates to hold the bone pieces in place.  °CAUSES °This injury may be caused by:  °· Hitting an object with a clenched fist. °· A hard, direct hit to the hand. °· An injury that crushes the hand. °RISK FACTORS °This injury is more likely to occur if: °· You are in a fistfight. °· You have certain bone diseases. °SYMPTOMS °Symptoms of this type of fracture develop soon after the injury. Symptoms may include: °· Swelling of the hand. °· Pain. °· Pain when moving the fifth finger or touching the hand. °· Abnormal position of the finger. °· Not being able to move the finger. °· A shortened finger. °· A finger knuckle that looks sunken in. °DIAGNOSIS °This injury may be diagnosed based on your symptoms, especially if you had a recent hand injury. Your health care provider will perform a physical exam, and you may also have X-rays to confirm the diagnosis. °TREATMENT  °Treatment for this injury depends on how severe it is. Possible treatments include: °· Closed reduction. If your bone is stable and can be moved back into place, you may only need to wear a cast or splint or have buddy taping. °· Open reduction with internal fixation (ORIF). This may be needed if your fracture is far out of place or goes through the joint surface of the bone. This treatment involves open surgery to move your bones back into the right position. Screws, wires, or plates may be used to stabilize the  fracture. °You may need to wear a cast or a splint for several weeks. You will also need to have follow-up X-rays to make sure that the bone is healing well and staying in position. After you no longer need the cast or splint, you may need physical therapy. This will help you to regain full movement and strength in your hand.  °HOME CARE INSTRUCTIONS °If You Have a Cast: °· Do not stick anything inside the cast to scratch your skin. Doing that increases your risk of infection. °· Check the skin around the cast every day. Report any concerns to your health care provider. You may put lotion on dry skin around the edges of the cast. Do not apply lotion to the skin underneath the cast. °If You Have a Splint: °· Wear it as directed by your health care provider. Remove it only as directed by your health care provider. °· Loosen the splint if your fingers become numb and tingle, or if they turn cold and blue. °Bathing °· Cover the cast or splint with a watertight plastic bag to protect it from water while you take a bath or a shower. Do not let the cast or splint get wet. °Managing Pain, Stiffness, and Swelling °· If directed, apply ice to the injured area (if you have a splint, not a cast): °¨ Put ice in a plastic bag. °¨ Place a towel between your skin and the bag. °¨   Leave the ice on for 20 minutes, 2-3 times a day. °· Move your fingers often to avoid stiffness and to lessen swelling. °· Raise the injured area above the level of your heart while you are sitting or lying down. °Driving °· Do not drive or operate heavy machinery while taking pain medicine. °· Do not drive while wearing a cast or splint on a hand or foot that you use for driving. °Activity °· Return to your normal activities as directed by your health care provider. Ask your health care provider what activities are safe for you. °General Instructions °· Do not put pressure on any part of the cast or splint until it is fully hardened. This may take several  hours. °· Keep the cast or splint clean and dry. °· Do not use any tobacco products, including cigarettes, chewing tobacco, or electronic cigarettes. Tobacco can delay bone healing. If you need help quitting, ask your health care provider. °· Take medicines only as directed by your health care provider. °· Keep all follow-up visits as directed by your health care provider. This is important. °SEEK MEDICAL CARE IF: °· Your pain is getting worse. °· You have redness, swelling, or pain in the injured area. °· You have fluid, blood, or pus coming from under your cast or splint. °· You notice a bad smell coming from under your cast or splint. °· You have a fever. °· Your cast or splint feels too tight or too loose. °· You cast is coming apart. °SEEK IMMEDIATE MEDICAL CARE IF: °· You develop a rash. °· You have trouble breathing. °· Your skin or nails on your injured hand turn blue or gray even after you loosen your splint. °· Your injured hand feels cold or becomes numb even after you loosen your splint. °· You develop severe pain under the cast or in your hand. °  °This information is not intended to replace advice given to you by your health care provider. Make sure you discuss any questions you have with your health care provider. °  °Document Released: 10/23/2005 Document Revised: 07/14/2015 Document Reviewed: 08/12/2014 °Elsevier Interactive Patient Education ©2016 [employer] ° °

## 2016-05-12 NOTE — ED Notes (Signed)
Patient shows no signs of adverse reaction to medication at this time.  

## 2016-05-27 NOTE — ED Provider Notes (Signed)
CSN: [id number]     Arrival date & time 04/05/16  1656 History   First MD Initiated Contact with Patient 04/05/16 1724     Chief Complaint  Patient presents with  . Abscess   (Consider location/radiation/quality/duration/timing/severity/associated sxs/prior Treatment) HPI Comments: 20 yo female with a c/o left shoulder and right buttock boils which are red, tender and have been draining some pus. Denies any fevers, chills.   Patient is a 20 y.o. female presenting with abscess. The history is provided by the patient.  Abscess   Past Medical History  Diagnosis Date  . Anxiety   . Depression   . PTSD (post-traumatic stress disorder)    Past Surgical History  Procedure Laterality Date  . Knee surgery Left    Family History  Problem Relation Age of Onset  . Hypertension Mother   . Depression Mother   . Anxiety disorder Mother    Social History  Substance Use Topics  . Smoking status: Current Some Day Smoker  . Smokeless tobacco: Never Used  . Alcohol Use: No   OB History    Gravida Para Term Preterm AB TAB SAB Ectopic Multiple Living   0 0 0 0 0 0 0 0       Review of Systems  Allergies  Norco and Norco  Home Medications   Prior to Admission medications   Medication Sig Start Date End Date Taking? Authorizing Provider  clonazePAM (KLONOPIN) 1 MG tablet Take 1 mg by mouth 2 (two) times daily as needed for anxiety.   Yes Historical Provider, MD  divalproex (DEPAKOTE) 125 MG DR tablet Take 125 mg by mouth 2 (two) times daily.   Yes Historical Provider, MD  DULoxetine (CYMBALTA) 60 MG capsule Take 60 mg by mouth daily.   Yes Historical Provider, MD  clonazePAM (KLONOPIN) 1 MG tablet Take 1 mg by mouth 3 (three) times daily as needed for anxiety.    Historical Provider, MD  divalproex (DEPAKOTE) 250 MG DR tablet Take 250 mg by mouth 2 (two) times daily.    Historical Provider, MD  DULoxetine (CYMBALTA) 60 MG capsule Take 60 mg by mouth daily.    Historical Provider, MD   oxyCODONE (ROXICODONE) 5 MG immediate release tablet Take 1 tablet (5 mg total) by mouth every 8 (eight) hours as needed. 05/04/16 05/04/17  Myrna Blazer, MD  oxyCODONE-acetaminophen (PERCOCET/ROXICET) 5-325 MG tablet Take 1-2 tablets by mouth every 8 (eight) hours as needed for severe pain. 05/12/16   Payton Mccallum, MD  sulfamethoxazole-trimethoprim (BACTRIM DS,SEPTRA DS) 800-160 MG tablet Take 1 tablet by mouth 2 (two) times daily. 04/05/16   Payton Mccallum, MD  tamsulosin (FLOMAX) 0.4 MG CAPS capsule Take 1 capsule (0.4 mg total) by mouth daily. 05/04/16   Myrna Blazer, MD   Meds Ordered and Administered this Visit  Medications - No data to display  BP 125/77 mmHg  Pulse 112  Temp(Src) 98.7 F (37.1 C) (Tympanic)  Resp 16  Ht 5\' 10"  (1.778 m)  Wt 175 lb (79.379 kg)  BMI 25.11 kg/m2  SpO2 100% No data found.   Physical Exam  Constitutional: She appears well-developed and well-nourished. No distress.  Skin: Rash noted. Rash is pustular. She is not diaphoretic. There is erythema.  Nursing note and vitals reviewed.   ED Course  Procedures (including critical care time)  Labs Review Labs Reviewed - No data to display  Imaging Review No results found.   Visual Acuity Review  Right Eye Distance:  Left Eye Distance:   Bilateral Distance:    Right Eye Near:   Left Eye Near:    Bilateral Near:         MDM   1. Cellulitis of right lower extremity   2. Cellulitis of left upper arm    Discharge Medication List as of 04/05/2016  6:05 PM     1.  diagnosis reviewed with patient 2. rx as per orders above; reviewed possible side effects, interactions, risks and benefits; rx for bactrim ds  3. Recommend supportive treatment with warm compresses, elevation 4. Follow-up prn if symptoms worsen or don't improve    Payton Mccallum, MD 05/27/16 1657

## 2016-09-11 IMAGING — CR DG FOREARM 2V*L*
2 series · 2 of 2 positions shown · non-contrast
Comparison: None.

CLINICAL DATA: Pain following fall

EXAM:
LEFT FOREARM - 2 VIEW

[forearm ap]
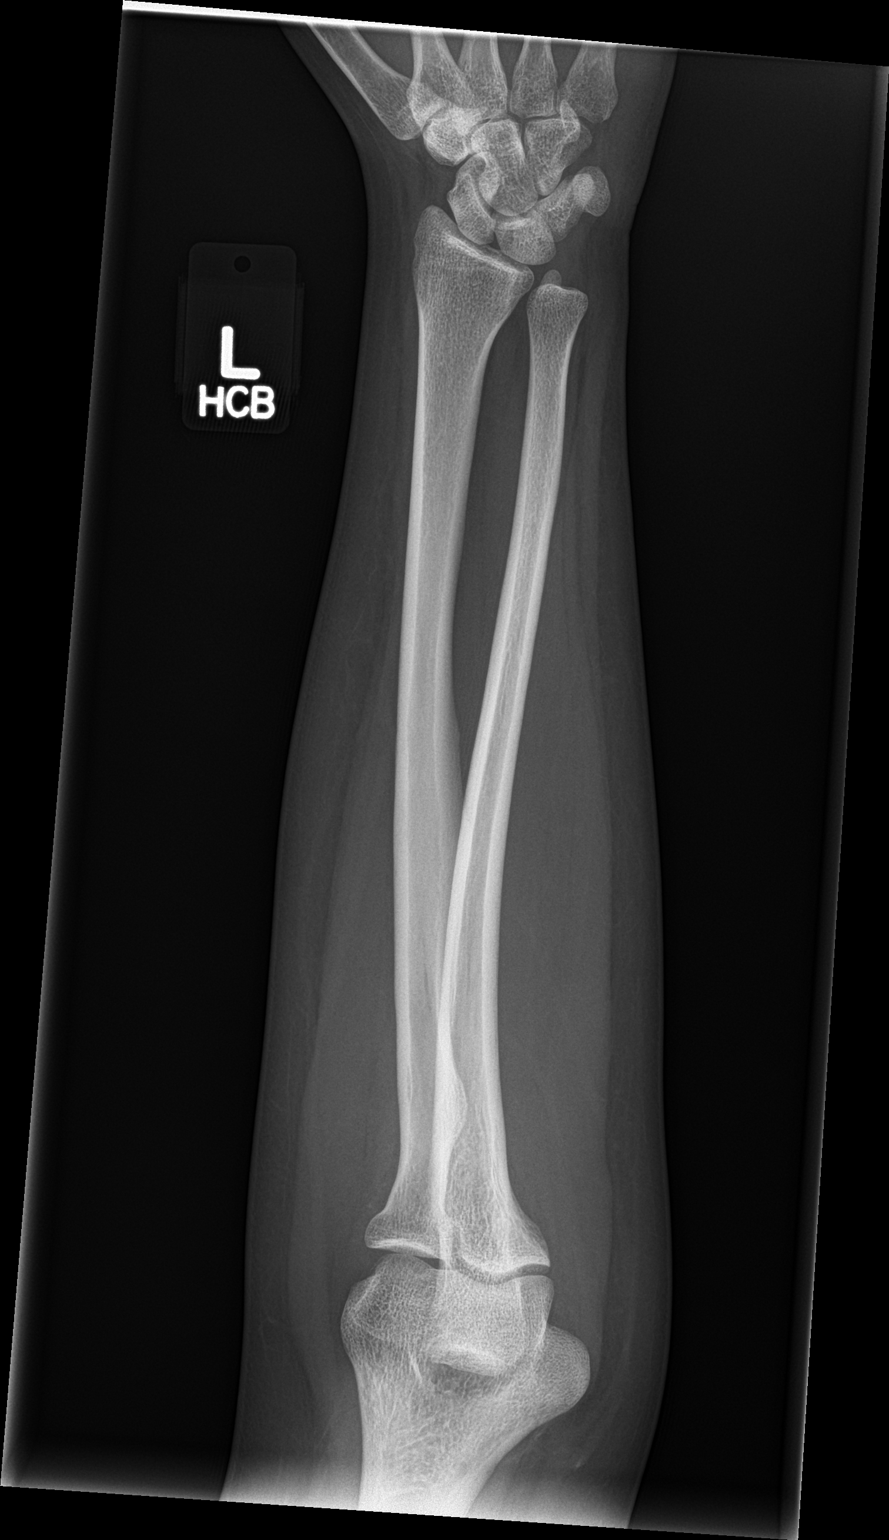

[forearm lat]
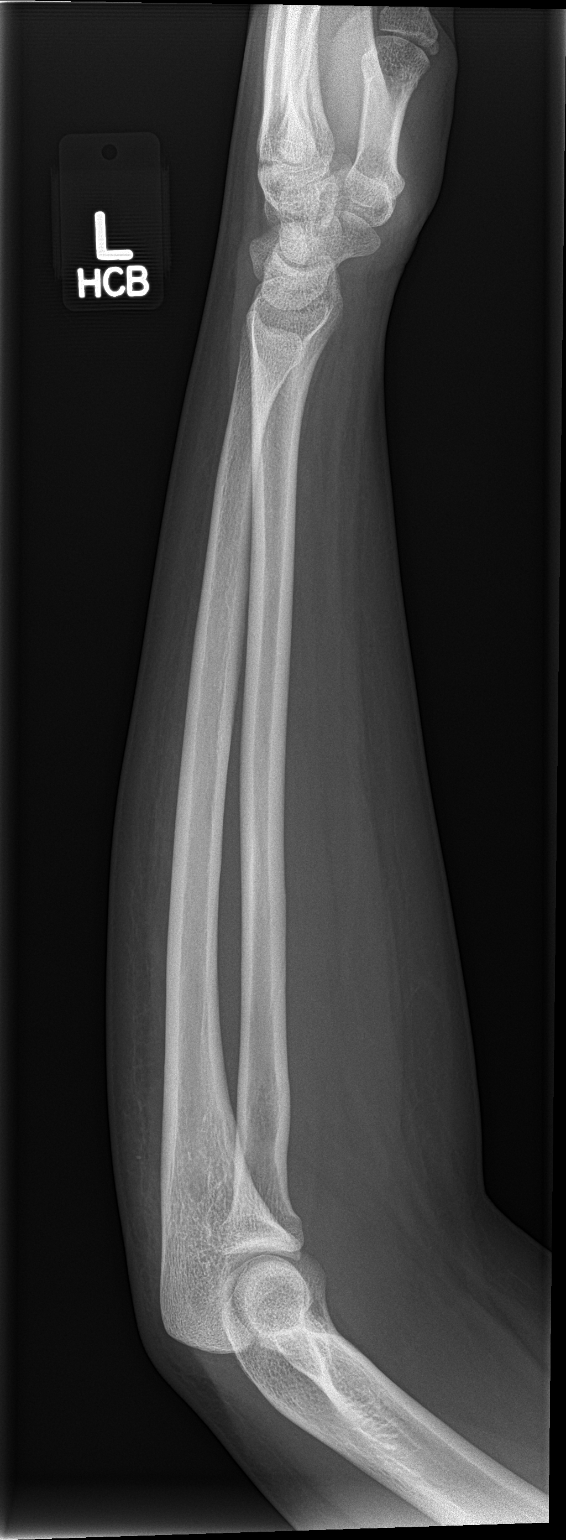

[2 of 2 positions shown; findings below may reference images not displayed]

FINDINGS: Frontal and lateral views obtained. There is no fracture or
dislocation. Joint spaces appear normal. No abnormal periosteal
reaction. Incidental note is made of a minus ulnar variance.
IMPRESSION: No fracture or dislocation. No apparent arthropathy. Minus ulnar
variance.

## 2016-09-11 IMAGING — CR DG HUMERUS 2V *L*
2 series · 2 of 2 positions shown · non-contrast
Comparison: None.

CLINICAL DATA: Pain following fall

EXAM:
LEFT HUMERUS - 2+ VIEW

[humerus ap]
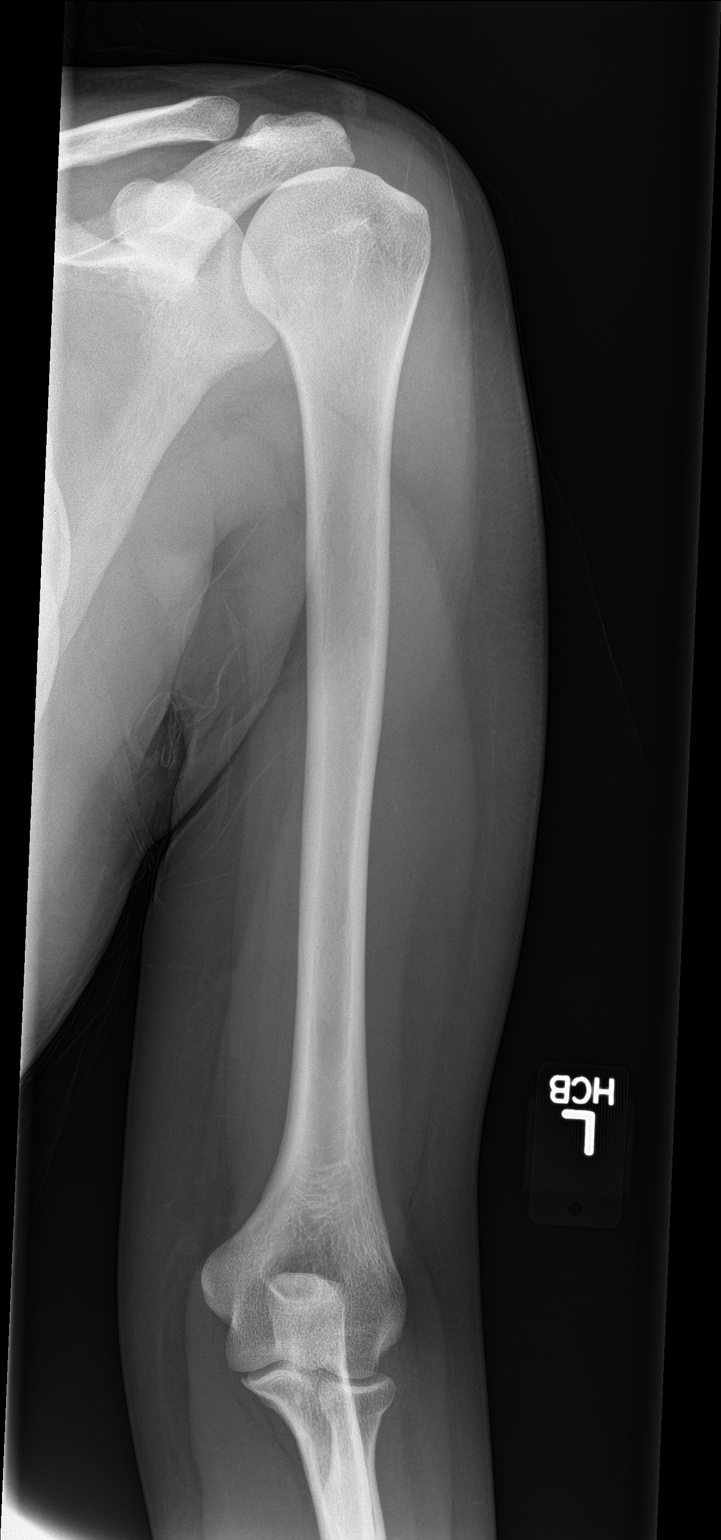

[humerus lat]
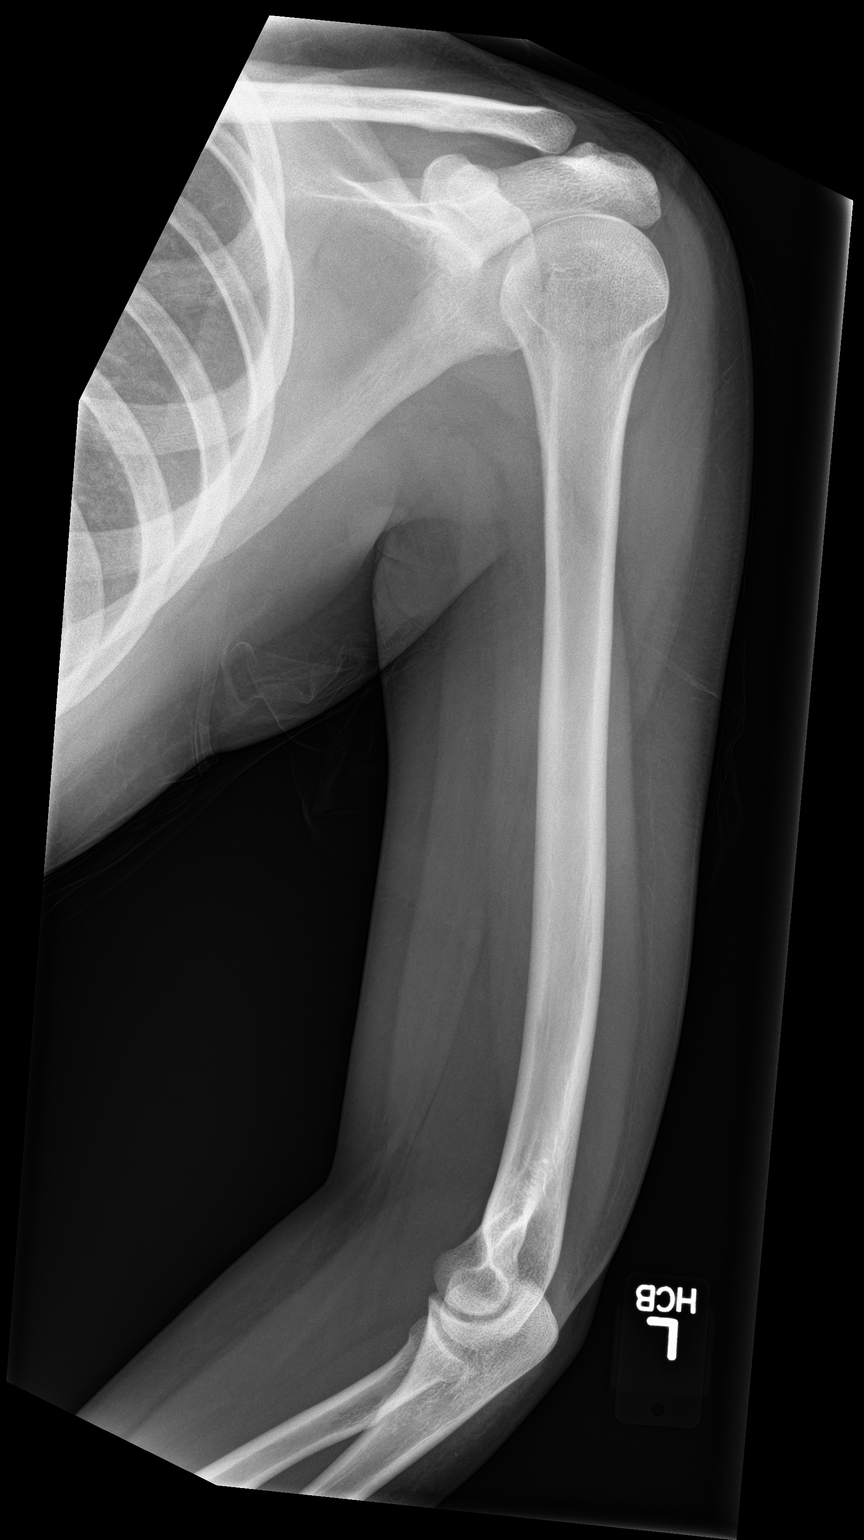

[2 of 2 positions shown; findings below may reference images not displayed]

FINDINGS: Frontal and lateral views obtained. No fracture or dislocation.
Joint spaces appear normal. No erosive change. No abnormal
periosteal reaction.
IMPRESSION: No fracture or dislocation.  No apparent arthropathy.

## 2016-09-11 IMAGING — CR DG HAND COMPLETE 3+V*L*
3 series · 3 of 3 positions shown · non-contrast
Comparison: None.

CLINICAL DATA: Fall on wall with left hand pain, initial encounter

EXAM:
LEFT HAND - COMPLETE 3+ VIEW

[hand ap]
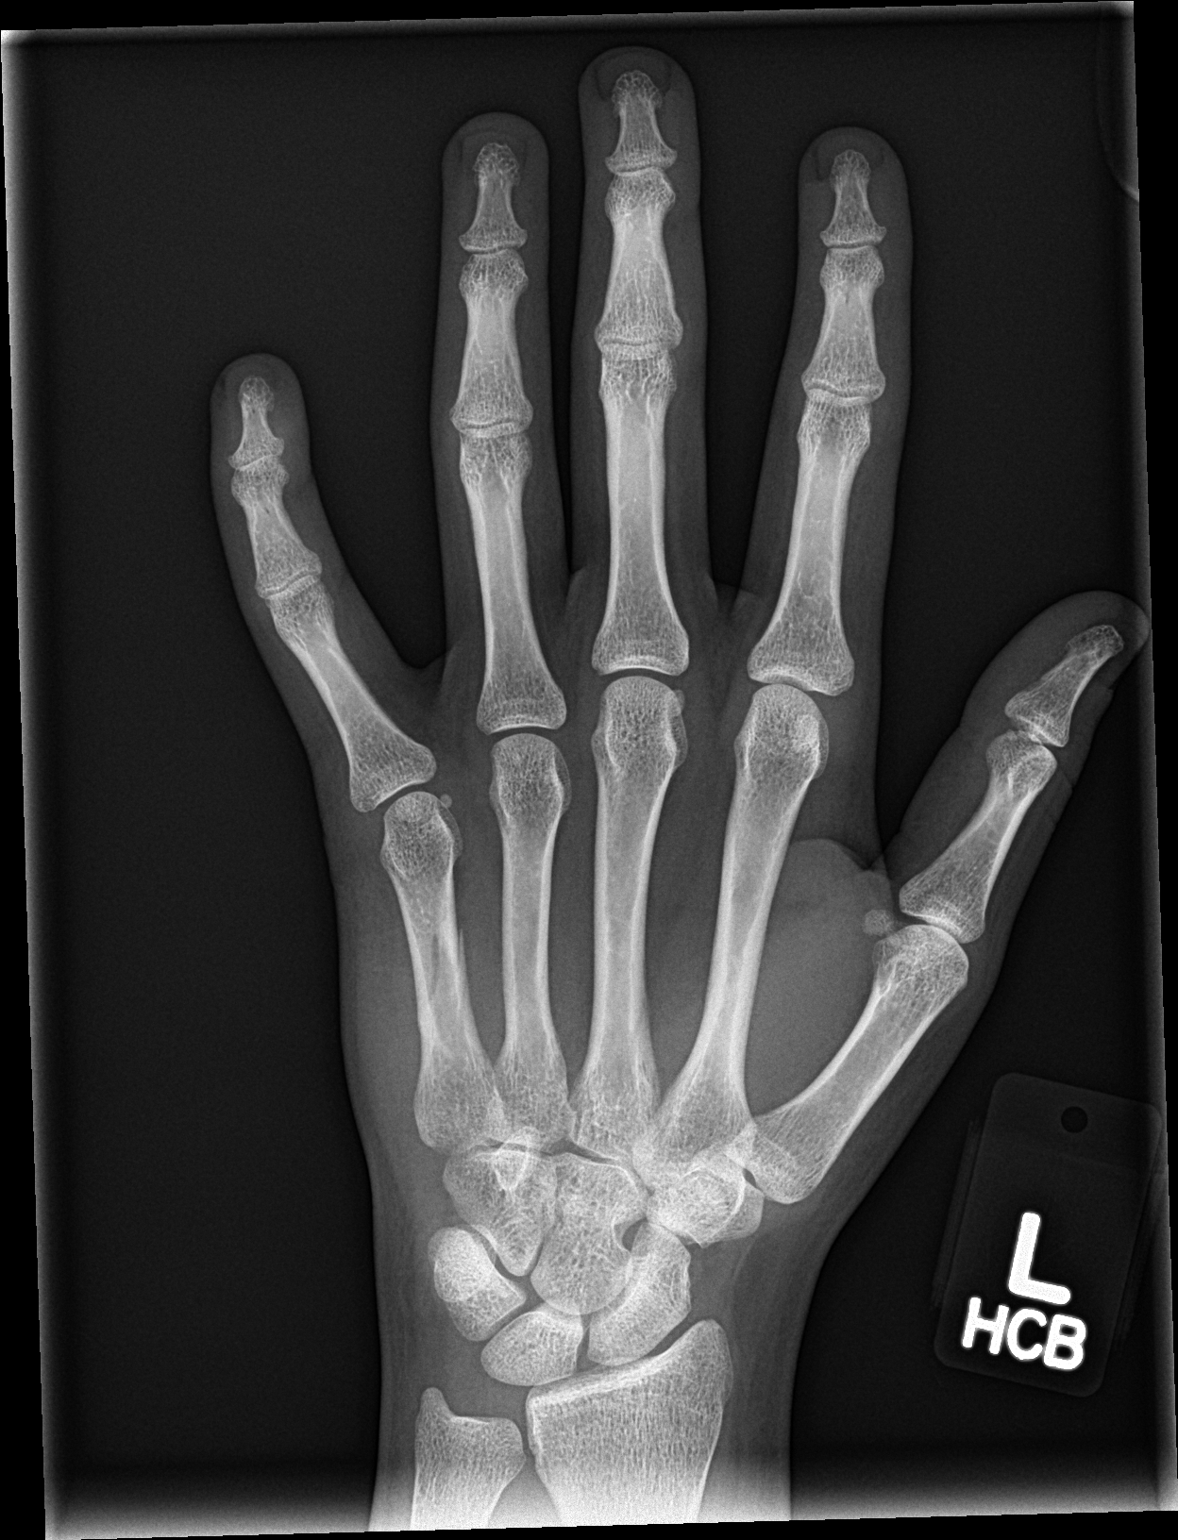

[hand obl]
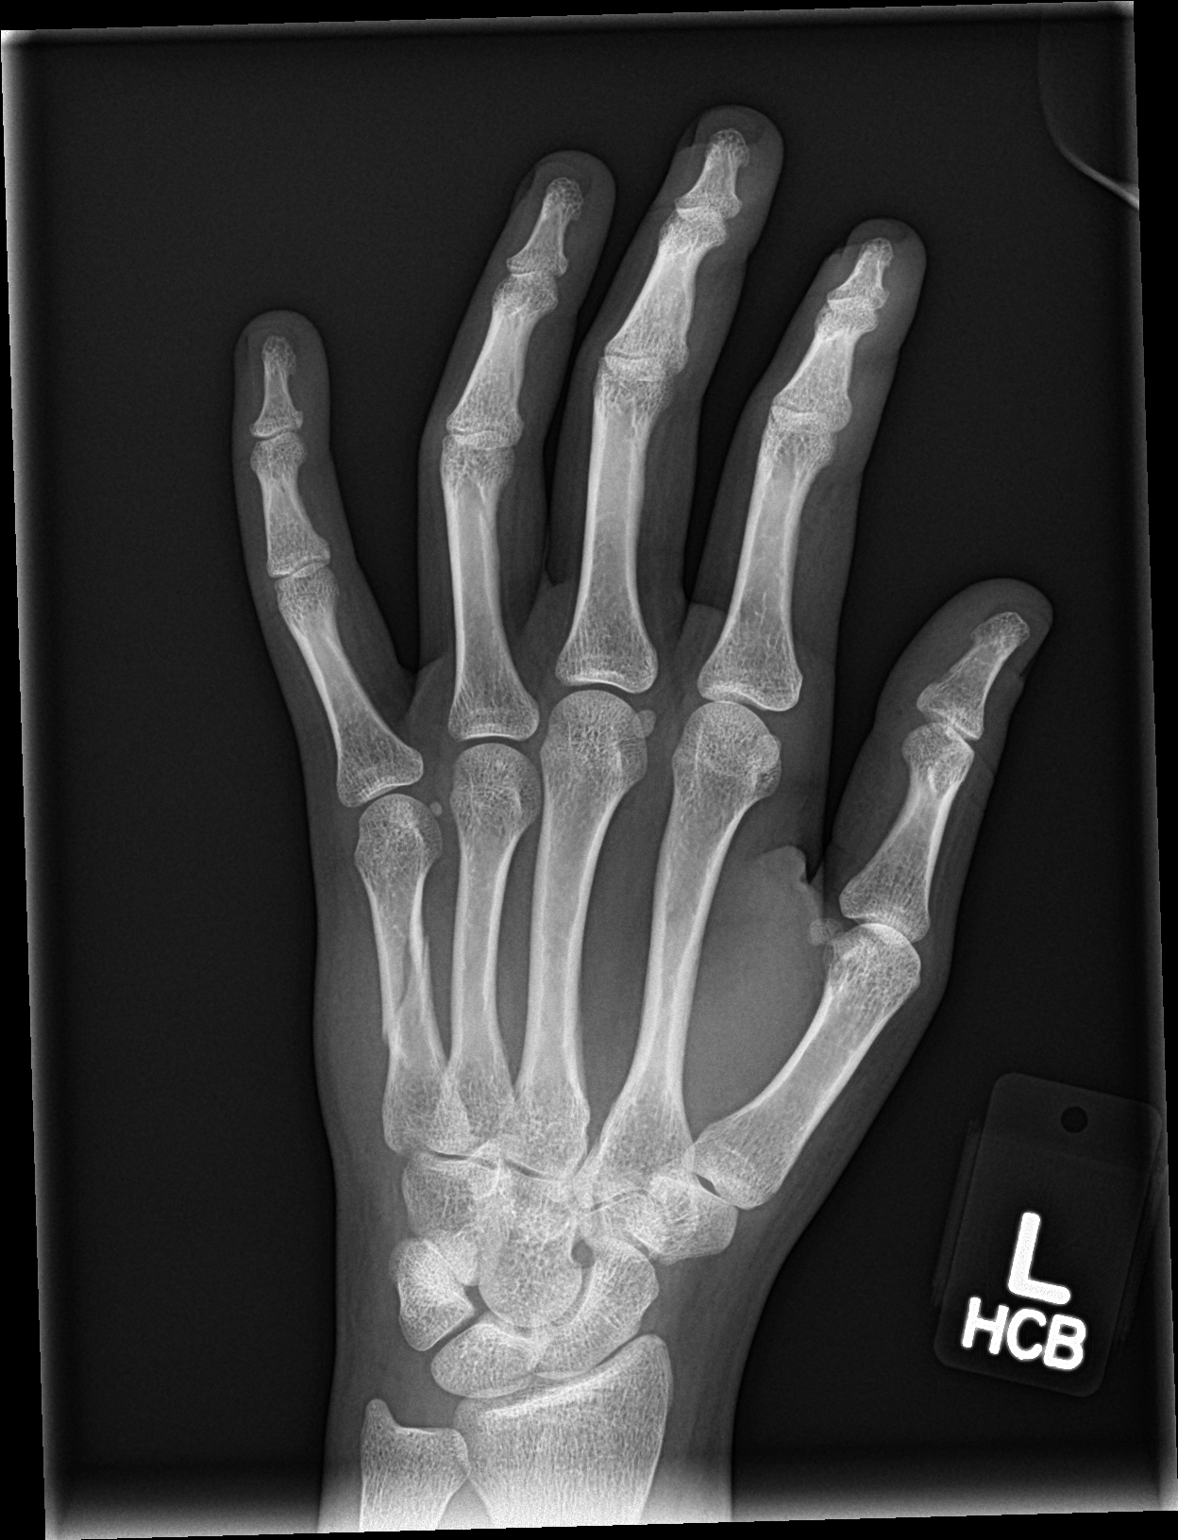

[hand lat]
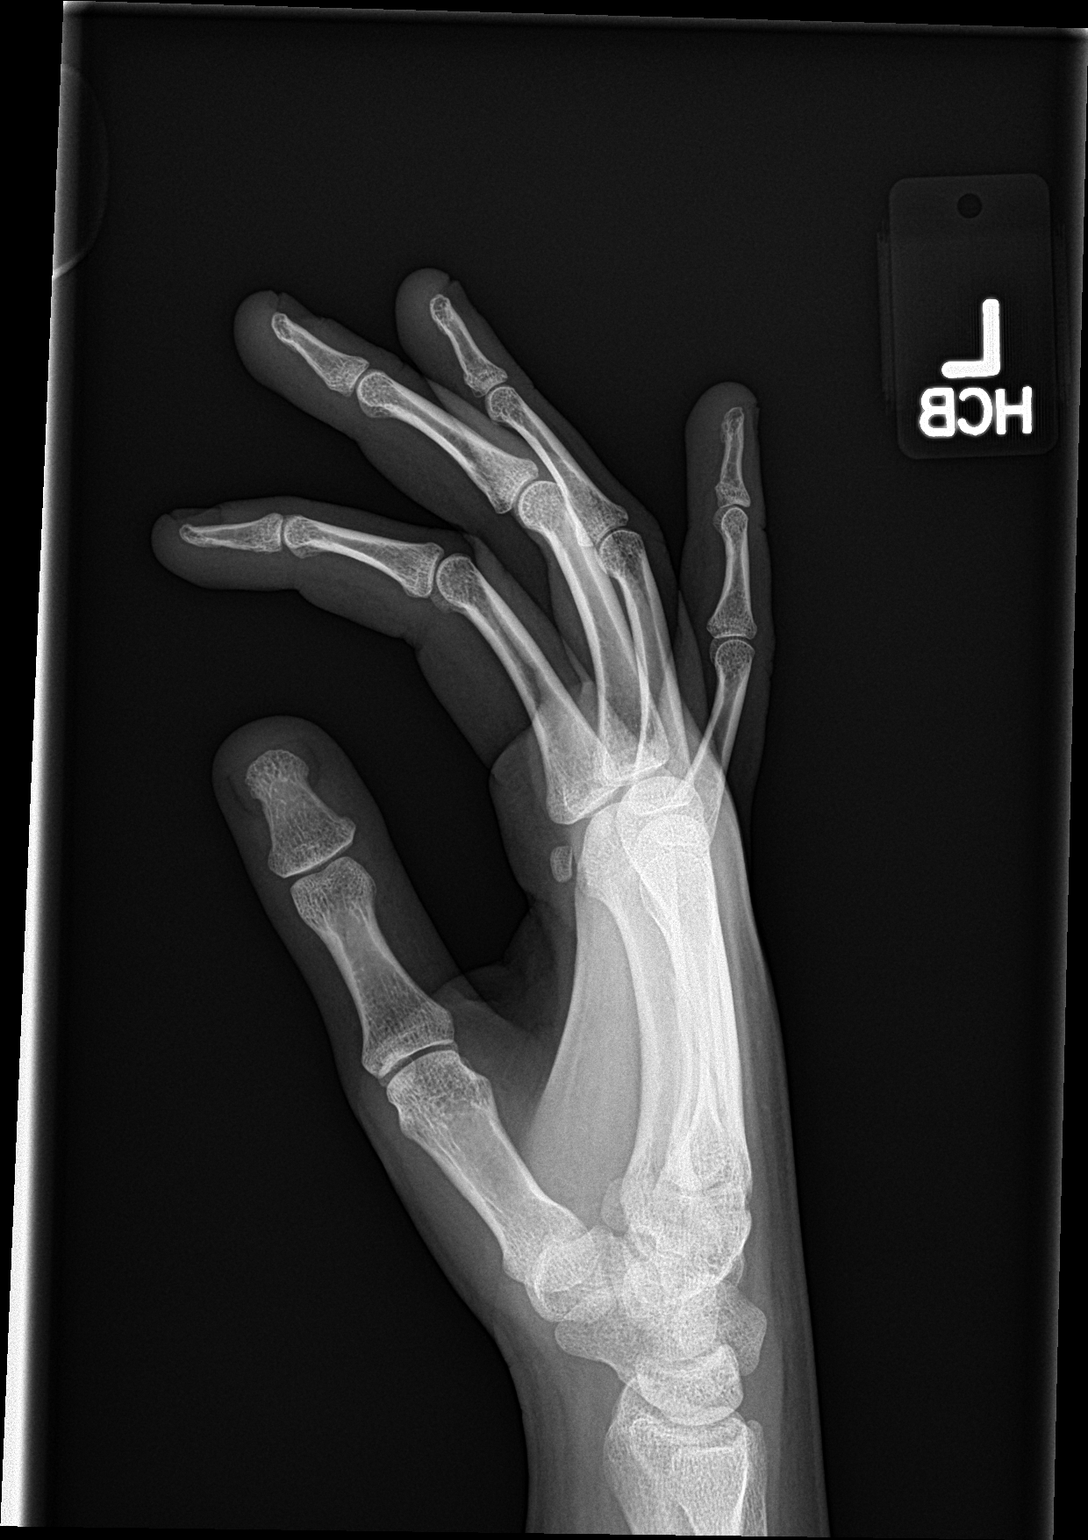

[3 of 3 positions shown; findings below may reference images not displayed]

FINDINGS: There is an oblique fracture through the midshaft of the fifth
metacarpal with mild impaction at the fracture site. No other
fractures are seen.
IMPRESSION: Fifth metacarpal fracture

## 2016-10-07 ENCOUNTER — Encounter: Payer: Self-pay | Admitting: Emergency Medicine

## 2016-10-07 ENCOUNTER — Emergency Department: Payer: Medicaid Other

## 2016-10-07 ENCOUNTER — Emergency Department
Admission: EM | Admit: 2016-10-07 | Discharge: 2016-10-07 | Disposition: A | Discharge status: Home / Self Care | Payer: Medicaid Other | Attending: Emergency Medicine | Admitting: Emergency Medicine

## 2016-10-07 DIAGNOSIS — F17200 Nicotine dependence, unspecified, uncomplicated: Secondary | ICD-10-CM | POA: Diagnosis not present

## 2016-10-07 DIAGNOSIS — Z79899 Other long term (current) drug therapy: Secondary | ICD-10-CM | POA: Insufficient documentation

## 2016-10-07 DIAGNOSIS — R112 Nausea with vomiting, unspecified: Secondary | ICD-10-CM

## 2016-10-07 DIAGNOSIS — N898 Other specified noninflammatory disorders of vagina: Secondary | ICD-10-CM | POA: Insufficient documentation

## 2016-10-07 DIAGNOSIS — E860 Dehydration: Secondary | ICD-10-CM | POA: Diagnosis not present

## 2016-10-07 LAB — CBC
HCT: 44.2 % (ref 35.0–47.0)
Hemoglobin: 15.7 g/dL (ref 12.0–16.0)
MCH: 32 pg (ref 26.0–34.0)
MCHC: 35.5 g/dL (ref 32.0–36.0)
MCV: 90.2 fL (ref 80.0–100.0)
PLATELETS: 279 10*3/uL (ref 150–440)
RBC: 4.89 MIL/uL (ref 3.80–5.20)
RDW: 12.5 % (ref 11.5–14.5)
WBC: 9.6 10*3/uL (ref 3.6–11.0)

## 2016-10-07 LAB — URINALYSIS COMPLETE WITH MICROSCOPIC (ARMC ONLY)
BILIRUBIN URINE: NEGATIVE
Bacteria, UA: NONE SEEN
Glucose, UA: NEGATIVE mg/dL
LEUKOCYTES UA: NEGATIVE
Nitrite: NEGATIVE
PH: 5 (ref 5.0–8.0)
Protein, ur: 30 mg/dL — AB
Specific Gravity, Urine: 1.023 (ref 1.005–1.030)

## 2016-10-07 LAB — COMPREHENSIVE METABOLIC PANEL
ALT: 18 U/L (ref 14–54)
ANION GAP: 14 (ref 5–15)
AST: 19 U/L (ref 15–41)
Albumin: 4.8 g/dL (ref 3.5–5.0)
Alkaline Phosphatase: 68 U/L (ref 38–126)
BUN: 14 mg/dL (ref 6–20)
CHLORIDE: 104 mmol/L (ref 101–111)
CO2: 17 mmol/L — ABNORMAL LOW (ref 22–32)
CREATININE: 0.71 mg/dL (ref 0.44–1.00)
Calcium: 9.8 mg/dL (ref 8.9–10.3)
Glucose, Bld: 75 mg/dL (ref 65–99)
POTASSIUM: 3.5 mmol/L (ref 3.5–5.1)
Sodium: 135 mmol/L (ref 135–145)
Total Bilirubin: 2.1 mg/dL — ABNORMAL HIGH (ref 0.3–1.2)
Total Protein: 7.8 g/dL (ref 6.5–8.1)

## 2016-10-07 LAB — WET PREP, GENITAL
Clue Cells Wet Prep HPF POC: NONE SEEN
SPERM: NONE SEEN
Trich, Wet Prep: NONE SEEN
Yeast Wet Prep HPF POC: NONE SEEN

## 2016-10-07 LAB — LACTIC ACID, PLASMA: Lactic Acid, Venous: 0.9 mmol/L (ref 0.5–1.9)

## 2016-10-07 LAB — LIPASE, BLOOD: LIPASE: 15 U/L (ref 11–51)

## 2016-10-07 LAB — CHLAMYDIA/NGC RT PCR (ARMC ONLY)
CHLAMYDIA TR: NOT DETECTED
N GONORRHOEAE: NOT DETECTED

## 2016-10-07 LAB — PREGNANCY, URINE: Preg Test, Ur: NEGATIVE

## 2016-10-07 MED ORDER — SODIUM CHLORIDE 0.9 % IV SOLN
1000.0000 mL | Freq: Once | INTRAVENOUS | Status: AC
  Administered 2016-10-07: 1000 mL via INTRAVENOUS

## 2016-10-07 MED ORDER — ONDANSETRON 4 MG PO TBDP: ORAL_TABLET | ORAL | 0 refills | Status: DC

## 2016-10-07 MED ORDER — SODIUM CHLORIDE 0.9 % IV BOLUS (SEPSIS)
1500.0000 mL | Freq: Once | INTRAVENOUS | Status: AC
  Administered 2016-10-07: 1500 mL via INTRAVENOUS

## 2016-10-07 MED ORDER — ONDANSETRON HCL 4 MG/2ML IJ SOLN
4.0000 mg | Freq: Once | INTRAMUSCULAR | Status: AC
  Administered 2016-10-07: 4 mg via INTRAVENOUS
  Filled 2016-10-07: qty 2

## 2016-10-07 NOTE — Discharge Instructions (Signed)
You have been seen in the Emergency Department (ED) today for a likely viral illness that led to dehydration.  Please drink plenty of clear fluids (water, Gatorade, chicken broth, etc).  You may use Tylenol and/or Motrin according to label instructions.  You can alternate between the two without any side effects.   Please follow up with your doctor as listed above.  As we discussed, your pelvic exam did not show any specific infection, but we encourage you to follow up with your primary care doctor or GYN specialist at the next available opportunity.  Call your doctor or return to the Emergency Department (ED) if you are unable to tolerate fluids due to vomiting, have worsening trouble breathing, become extremely tired or difficult to awaken, or if you develop any other symptoms that concern you.

## 2016-10-07 NOTE — ED Triage Notes (Signed)
Pt to ed with c/o vomiting x 5 days.  Pt also reports cough, and diarrhea.

## 2016-10-07 NOTE — ED Provider Notes (Signed)
[facility] Emergency Department Provider Note  ____________________________________________   First MD Initiated Contact with Patient 10/07/16 1108     (approximate)  I have reviewed the triage vital signs and the nursing notes.   HISTORY  Chief Complaint Emesis and Cough    HPI Abigail Parker is a 20 y.o. female 's medical history only includes anxiety, depression, and PTSD who presents for evaluation of about 5 days of a constellation of symptoms that includes general malaise, cough (improved), persistent nausea and vomiting, decreased intake, and constipation (not diarrhea as well as reported in triage).  She states that she woke up about 5 days ago and felt like she was "getting a cold" with nasal congestion, mild sore throat, runny nose.  She then developed nausea and vomiting.  She states that the nausea and vomiting have persisted but that it seems to be worse overnight and until the early afternoon each day for the last 5 days.  She has had intermittent mild suprapubic cramping and also reports vaginal discharge "that is like Elmer's glue".She says that her cough and shortness of breath have gotten better but she gets extremely dizzy whenever she stands up and her symptoms are a with exertion.  She states that she has passed out or almost passed out a couple of times and she is getting up and moving around and thinks that she is dehydrated.  Her sore throat has completely resolved and most of her nasal congestion and runny nose have improved.  She denies any abdominal pain except for the occasional suprapubic pain and that she thinks that might be related to the vaginal discharge.  She smokes but has not done so in the last 5 days.  Past Medical History:  Diagnosis Date  . Anxiety   . Depression   . PTSD (post-traumatic stress disorder)     There are no active problems to display for this patient.   Past Surgical History:  Procedure  Laterality Date  . KNEE SURGERY Left     Prior to Admission medications   Medication Sig Start Date End Date Taking? Authorizing Provider  clonazePAM (KLONOPIN) 1 MG tablet Take 1 mg by mouth 3 (three) times daily as needed for anxiety.    Historical Provider, MD  divalproex (DEPAKOTE) 125 MG DR tablet Take 125 mg by mouth 2 (two) times daily.    Historical Provider, MD  divalproex (DEPAKOTE) 250 MG DR tablet Take 250 mg by mouth 2 (two) times daily.    Historical Provider, MD  DULoxetine (CYMBALTA) 60 MG capsule Take 60 mg by mouth daily.    Historical Provider, MD  ondansetron (ZOFRAN ODT) 4 MG disintegrating tablet Allow 1-2 tablets to dissolve in your mouth every 8 hours as needed for nausea/vomiting 10/07/16   Loleta Roseory Anjolina Byrer, MD  oxyCODONE (ROXICODONE) 5 MG immediate release tablet Take 1 tablet (5 mg total) by mouth every 8 (eight) hours as needed. 05/04/16 05/04/17  Myrna Blazeravid Matthew Schaevitz, MD  oxyCODONE-acetaminophen (PERCOCET/ROXICET) 5-325 MG tablet Take 1-2 tablets by mouth every 8 (eight) hours as needed for severe pain. 05/12/16   Payton Mccallumrlando Conty, MD  tamsulosin (FLOMAX) 0.4 MG CAPS capsule Take 1 capsule (0.4 mg total) by mouth daily. 05/04/16   Myrna Blazeravid Matthew Schaevitz, MD    Allergies Norco [hydrocodone-acetaminophen] and Norco [hydrocodone-acetaminophen]  Family History  Problem Relation Age of Onset  . Hypertension Mother   . Depression Mother   . Anxiety disorder Mother     Social History Social  History  Substance Use Topics  . Smoking status: Current Some Day Smoker  . Smokeless tobacco: Never Used  . Alcohol use No    Review of Systems Constitutional: Subjective fever/chills 5 days ago, but resolved several days ago.   Eyes: No visual changes. ENT: Prior sore throat, now resolved Cardiovascular: Denies chest pain. Respiratory: Prior shortness of breath, resolved, but persistent cough Gastrointestinal: Occasional suprapubic pain.  N/V x 5 days, usually during the  night.  No diarrhea.  +constipation. Genitourinary: Negative for dysuria. +vaginal discharge "like Elmer's glue" Musculoskeletal: Negative for back pain. Skin: Negative for rash. Neurological: Negative for headaches, focal weakness or numbness.  10-point ROS otherwise negative.  ____________________________________________   PHYSICAL EXAM:  VITAL SIGNS: ED Triage Vitals  Enc Vitals Group     BP 10/07/16 1031 139/86     Pulse Rate 10/07/16 1031 (!) 140     Resp 10/07/16 1031 20     Temp 10/07/16 1031 97.9 F (36.6 C)     Temp Source 10/07/16 1031 Oral     SpO2 10/07/16 1031 100 %     Weight 10/07/16 1029 176 lb (79.8 kg)     Height 10/07/16 1029 5\' 9"  (1.753 m)     Head Circumference --      Peak Flow --      Pain Score 10/07/16 1029 10     Pain Loc --      Pain Edu? --      Excl. in GC? --     Constitutional: Alert and oriented. Well appearing and in no acute distress. Eyes: Conjunctivae are normal. PERRL. EOMI. Head: Atraumatic. Nose: No congestion/rhinnorhea. Mouth/Throat: Mucous membranes are moist.  Oropharynx non-erythematous. Neck: No stridor.  No meningeal signs.   Cardiovascular: HR 140 when standing and ambulating, down in 70s while at rest in ED bed, regular rhythm. Good peripheral circulation. Grossly normal heart sounds. Respiratory: Normal respiratory effort.  No retractions. Lungs CTAB. Gastrointestinal: Soft and nontender. No distention.  Genitourinary: Normal external exam.  Copious yellowish white discharge in the vaginal canal.  IUD strings are coming out of the cervix.  There is a very small amount of friable tissue on the cervix.  Minimal cervical tenderness On digital exam.  Nurse chaperones present throughout exam. Musculoskeletal: No lower extremity tenderness nor edema. No gross deformities of extremities. Neurologic:  Normal speech and language. No gross focal neurologic deficits are appreciated.  Skin:  Skin is warm, dry and intact. No rash  noted. Psychiatric: Mood and affect are normal. Speech and behavior are normal.  ____________________________________________   LABS (all labs ordered are listed, but only abnormal results are displayed)  Labs Reviewed  WET PREP, GENITAL - Abnormal; Notable for the following:       Result Value   WBC, Wet Prep HPF POC MANY (*)    All other components within normal limits  COMPREHENSIVE METABOLIC PANEL - Abnormal; Notable for the following:    CO2 17 (*)    Total Bilirubin 2.1 (*)    All other components within normal limits  URINALYSIS COMPLETEWITH MICROSCOPIC ([facility] ONLY) - Abnormal; Notable for the following:    Color, Urine YELLOW (*)    APPearance CLEAR (*)    Ketones, ur 2+ (*)    Hgb urine dipstick 1+ (*)    Protein, ur 30 (*)    Squamous Epithelial / LPF 0-5 (*)    All other components within normal limits  CHLAMYDIA/NGC RT PCR ([facility] ONLY)  CBC  LIPASE, BLOOD  LACTIC ACID, PLASMA  PREGNANCY, URINE   ____________________________________________  EKG  None - EKG not ordered by ED physician ____________________________________________  RADIOLOGY   Dg Chest 2 View  Result Date: 10/07/2016 CLINICAL DATA:  20 year old female currently not feeling well. Concerned she has the flu. EXAM: CHEST  2 VIEW COMPARISON:  None. FINDINGS: The lungs are clear and negative for focal airspace consolidation, pulmonary edema or suspicious pulmonary nodule. No pleural effusion or pneumothorax. Cardiac and mediastinal contours are within normal limits. No acute fracture or lytic or blastic osseous lesions. The visualized upper abdominal bowel gas pattern is unremarkable. IMPRESSION: Negative chest x-ray. Electronically Signed   By: Malachy MoanHeath  McCullough M.D.   On: 10/07/2016 11:53    ____________________________________________   PROCEDURES  Procedure(s) performed:   Procedures   Critical Care performed: No ____________________________________________   INITIAL  IMPRESSION / ASSESSMENT AND PLAN / ED COURSE  Pertinent labs & imaging results that were available during my care of the patient were reviewed by me and considered in my medical decision making (see chart for details).  Patient very well appearing in spite of triage tachycardia.  Believe this represents significant orthostasis, proceeding with fluid resuscitation.  Labs pending, no leukocytosis, does not meet sepsis criteria.  Checking  lactic acid, 2-view CXR, UA.  Metabolic panel pending.  After rest of workup will plan for pelvic exam given complaint of severe vaginal discharge and occasional discomfort.  Clinical Course as of Oct 07 1554  Sat Oct 07, 2016  1336 The patient feels significantly better after fluid resuscitation.  Her labs are notable for having 2+ ketones in her urine which is consistent with the orthostasis.  She has a slightly elevated T bili but I suspect this is volume related and due to the recent vomiting.  We are awaiting results of her wet prep and GC/chlamydia.  She is comfortable with plan.  Given the amount of discharge and a small amount of friable tissue I think it is a good idea for us to get back to results of her STD testing prior to discharge.  [CF]  1554 GC/chlamydia negative.  Wet prep shows WBCs but Trichomonas, yeast, and clue cells are all negative.  I discussed all this with her and she has a GYN that she can follow up with soon.  I advised her that although she has no sign of specific infection at this point, she should follow-up at the next available opportunity.  She states that she feels "SO much better" after her IV fluids and is ready to go home.I gave my usual and customary return precautions.   [CF]    Clinical Course User Index [CF] Loleta Roseory Johnelle Tafolla, MD    ____________________________________________  FINAL CLINICAL IMPRESSION(S) / ED DIAGNOSES  Final diagnoses:  Dehydration  Non-intractable vomiting with nausea, unspecified vomiting type    Vaginal discharge     MEDICATIONS GIVEN DURING THIS VISIT:  Medications  0.9 %  sodium chloride infusion (0 mLs Intravenous Stopped 10/07/16 1306)  ondansetron (ZOFRAN) injection 4 mg (4 mg Intravenous Given 10/07/16 1059)  sodium chloride 0.9 % bolus 1,500 mL (0 mLs Intravenous Stopped 10/07/16 1545)     NEW OUTPATIENT MEDICATIONS STARTED DURING THIS VISIT:  New Prescriptions   ONDANSETRON (ZOFRAN ODT) 4 MG DISINTEGRATING TABLET    Allow 1-2 tablets to dissolve in your mouth every 8 hours as needed for nausea/vomiting    Modified Medications   No medications on file    Discontinued Medications  CLONAZEPAM (KLONOPIN) 1 MG TABLET    Take 1 mg by mouth 2 (two) times daily as needed for anxiety.   DULOXETINE (CYMBALTA) 60 MG CAPSULE    Take 60 mg by mouth daily.   SULFAMETHOXAZOLE-TRIMETHOPRIM (BACTRIM DS,SEPTRA DS) 800-160 MG TABLET    Take 1 tablet by mouth 2 (two) times daily.     Note:  This document was prepared using Dragon voice recognition software and may include unintentional dictation errors.    Loleta Rose, MD 10/07/16 [id number]

## 2017-05-15 ENCOUNTER — Emergency Department
Admission: EM | Admit: 2017-05-15 | Discharge: 2017-05-15 | Disposition: A | Discharge status: Home / Self Care | Payer: Medicaid Other | Attending: Emergency Medicine | Admitting: Emergency Medicine

## 2017-05-15 ENCOUNTER — Encounter: Payer: Self-pay | Admitting: *Deleted

## 2017-05-15 DIAGNOSIS — Z79899 Other long term (current) drug therapy: Secondary | ICD-10-CM | POA: Diagnosis not present

## 2017-05-15 DIAGNOSIS — R6884 Jaw pain: Secondary | ICD-10-CM | POA: Diagnosis present

## 2017-05-15 DIAGNOSIS — F17200 Nicotine dependence, unspecified, uncomplicated: Secondary | ICD-10-CM | POA: Diagnosis not present

## 2017-05-15 DIAGNOSIS — L0291 Cutaneous abscess, unspecified: Secondary | ICD-10-CM | POA: Diagnosis not present

## 2017-05-15 MED ORDER — IBUPROFEN 600 MG PO TABS: 600.0000 mg | ORAL_TABLET | Freq: Three times a day (TID) | ORAL | 0 refills | Status: DC | PRN

## 2017-05-15 MED ORDER — SULFAMETHOXAZOLE-TRIMETHOPRIM 800-160 MG PO TABS
1.0000 | ORAL_TABLET | Freq: Once | ORAL | Status: AC
  Administered 2017-05-15: 1 via ORAL
  Filled 2017-05-15: qty 1

## 2017-05-15 MED ORDER — IBUPROFEN 600 MG PO TABS
600.0000 mg | ORAL_TABLET | Freq: Once | ORAL | Status: AC
  Administered 2017-05-15: 600 mg via ORAL
  Filled 2017-05-15: qty 1

## 2017-05-15 MED ORDER — SULFAMETHOXAZOLE-TRIMETHOPRIM 800-160 MG PO TABS: 1.0000 | ORAL_TABLET | Freq: Two times a day (BID) | ORAL | 0 refills | Status: DC

## 2017-05-15 MED ORDER — TRAMADOL HCL 50 MG PO TABS
50.0000 mg | ORAL_TABLET | Freq: Once | ORAL | Status: AC
  Administered 2017-05-15: 50 mg via ORAL
  Filled 2017-05-15: qty 1

## 2017-05-15 MED ORDER — TRAMADOL HCL 50 MG PO TABS: 50.0000 mg | ORAL_TABLET | Freq: Four times a day (QID) | ORAL | 0 refills | Status: DC | PRN

## 2017-05-15 NOTE — ED Provider Notes (Signed)
[facility] Emergency Department Provider Note   ____________________________________________   None    (approximate)  I have reviewed the triage vital signs and the nursing notes.   HISTORY  Chief Complaint Abscess    HPI Abigail Parker is a 21 y.o. female patient complaining of painful papular lesion anterior inferior mandible times one week.Patient denies any drainage. Patient rates the pain as a 10 over 10. Patient described a pain as "achy". 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 No palliative measures for complaint.   Past Medical History:  Diagnosis Date  . Anxiety   . Depression   . PTSD (post-traumatic stress disorder)     There are no active problems to display for this patient.   Past Surgical History:  Procedure Laterality Date  . KNEE SURGERY Left     Prior to Admission medications   Medication Sig Start Date End Date Taking? Authorizing Provider  clonazePAM (KLONOPIN) 1 MG tablet Take 1 mg by mouth 3 (three) times daily as needed for anxiety.    [provider]  divalproex (DEPAKOTE) 125 MG DR tablet Take 125 mg by mouth 2 (two) times daily.    [provider]  divalproex (DEPAKOTE) 250 MG DR tablet Take 250 mg by mouth 2 (two) times daily.    [provider]  DULoxetine (CYMBALTA) 60 MG capsule Take 60 mg by mouth daily.    [provider]  ibuprofen (ADVIL,MOTRIN) 600 MG tablet Take 1 tablet (600 mg total) by mouth every 8 (eight) hours as needed. 05/15/17   Joni ReiningSmith, Merced Brougham K, PA-C  ondansetron (ZOFRAN ODT) 4 MG disintegrating tablet Allow 1-2 tablets to dissolve in your mouth every 8 hours as needed for nausea/vomiting 10/07/16   Loleta RoseForbach, Cory, MD  oxyCODONE-acetaminophen (PERCOCET/ROXICET) 5-325 MG tablet Take 1-2 tablets by mouth every 8 (eight) hours as needed for severe pain. 05/12/16   Payton Mccallumonty, [place], MD  sulfamethoxazole-trimethoprim (BACTRIM DS,SEPTRA DS) 800-160 MG tablet  Take 1 tablet by mouth 2 (two) times daily. 05/15/17   Joni ReiningSmith, Tafari Humiston K, PA-C  tamsulosin (FLOMAX) 0.4 MG CAPS capsule Take 1 capsule (0.4 mg total) by mouth daily. 05/04/16   Schaevitz, Myra Rudeavid Matthew, MD  traMADol (ULTRAM) 50 MG tablet Take 1 tablet (50 mg total) by mouth every 6 (six) hours as needed for moderate pain. 05/15/17   Joni ReiningSmith, Josephyne Tarter K, PA-C    Allergies Norco [hydrocodone-acetaminophen] and Norco [hydrocodone-acetaminophen]  Family History  Problem Relation Age of Onset  . Hypertension Mother   . Depression Mother   . Anxiety disorder Mother     Social History Social History  Substance Use Topics  . Smoking status: Current Some Day Smoker  . Smokeless tobacco: Never Used  . Alcohol use No    Review of Systems  Constitutional: No fever/chills Eyes: No visual changes. ENT: No sore throat. Cardiovascular: Denies chest pain. Respiratory: Denies shortness of breath. Gastrointestinal: No abdominal pain.  No nausea, no vomiting.  No diarrhea.  No constipation. Genitourinary: Negative for dysuria. Musculoskeletal: Negative for back pain. Skin: Negative for rash. Neurological: Negative for headaches, focal weakness or numbness. Psychiatric:Anxiety, depression, and PTSD. Allergic/Immunilogical: Norco ____________________________________________   PHYSICAL EXAM:  VITAL SIGNS: ED Triage Vitals [05/15/17 0805]  Enc Vitals Group     BP (!) 145/82     Pulse Rate 75     Resp 16     Temp 99.1 F (37.3 C)     Temp Source Oral     SpO2  100 %     Weight 175 lb (79.4 kg)     Height 5\' 10"  (1.778 m)     Head Circumference      Peak Flow      Pain Score 9     Pain Loc      Pain Edu?      Excl. in GC?     Constitutional: Alert and oriented. Well appearing and in no acute distress. Anxious. Eyes: Conjunctivae are normal. PERRL. EOMI. Head: Atraumatic. Nose: No congestion/rhinnorhea. Mouth/Throat: Mucous membranes are moist.  Oropharynx non-erythematous. Neck: No  stridor.  Hematological/Lymphatic/Immunilogical: No cervical lymphadenopathy. Cardiovascular: Normal rate, regular rhythm. Grossly normal heart sounds.  Good peripheral circulation. Respiratory: Normal respiratory effort.  No retractions. Lungs CTAB. Neurologic:  Normal speech and language. No gross focal neurologic deficits are appreciated. No gait instability. Skin:  Skin is warm, dry and intact. No rash noted. Psychiatric: Mood and affect are normal. Speech and behavior are normal.  ____________________________________________   LABS (all labs ordered are listed, but only abnormal results are displayed)  Labs Reviewed - No data to display ____________________________________________  EKG   ____________________________________________  RADIOLOGY  No results found.  ____________________________________________   PROCEDURES  Procedure(s) performed: None  Procedures  Critical Care performed: No  ____________________________________________   INITIAL IMPRESSION / ASSESSMENT AND PLAN / ED COURSE  Pertinent labs & imaging results that were available during my care of the patient were reviewed by me and considered in my medical decision making (see chart for details).  Erythematous papular lesion left mandible. Area is nonfluctuant. Discussed patient reason for an I&D at this time. Patient given discharge Instructions. Patient advised follow-up family doctor return back to the ED if condition worsens.      ____________________________________________   FINAL CLINICAL IMPRESSION(S) / ED DIAGNOSES  Final diagnoses:  Abscess      NEW MEDICATIONS STARTED DURING THIS VISIT:  New Prescriptions   IBUPROFEN (ADVIL,MOTRIN) 600 MG TABLET    Take 1 tablet (600 mg total) by mouth every 8 (eight) hours as needed.   SULFAMETHOXAZOLE-TRIMETHOPRIM (BACTRIM DS,SEPTRA DS) 800-160 MG TABLET    Take 1 tablet by mouth 2 (two) times daily.   TRAMADOL (ULTRAM) 50 MG TABLET     Take 1 tablet (50 mg total) by mouth every 6 (six) hours as needed for moderate pain.     Note:  This document was prepared using Dragon voice recognition software and may include unintentional dictation errors.    Joni Reining, PA-C 05/15/17 1610    Jeanmarie Plant, MD 05/15/17 [phone]

## 2017-05-15 NOTE — ED Notes (Signed)
See triage note  Developed a red raised area to chin about 1 week ago  States area is larger today

## 2017-05-15 NOTE — ED Triage Notes (Signed)
Stats a red hard area on her skin on her chin that she noticed 1 week ago, states hx of mrsa

## 2018-04-22 ENCOUNTER — Encounter: Payer: Self-pay | Admitting: Certified Nurse Midwife

## 2018-04-22 ENCOUNTER — Ambulatory Visit (INDEPENDENT_AMBULATORY_CARE_PROVIDER_SITE_OTHER): Payer: BLUE CROSS/BLUE SHIELD | Admitting: Certified Nurse Midwife

## 2018-04-22 VITALS — BP 102/68 | HR 64 | Ht 70.0 in | Wt 168.1 lb

## 2018-04-22 DIAGNOSIS — Z30432 Encounter for removal of intrauterine contraceptive device: Secondary | ICD-10-CM

## 2018-04-22 NOTE — Progress Notes (Signed)
New pt is here for Mirena removal. States it was inserted about 3 years ago at [facility].

## 2018-04-22 NOTE — Patient Instructions (Addendum)
Preventive Care 18-39 Years, Female Preventive care refers to lifestyle choices and visits with your health care provider that can promote health and wellness. What does preventive care include?  A yearly physical exam. This is also called an annual well check.  Dental exams once or twice a year.  Routine eye exams. Ask your health care provider how often you should have your eyes checked.  Personal lifestyle choices, including: ? Daily care of your teeth and gums. ? Regular physical activity. ? Eating a healthy diet. ? Avoiding tobacco and drug use. ? Limiting alcohol use. ? Practicing safe sex. ? Taking vitamin and mineral supplements as recommended by your health care provider. What happens during an annual well check? The services and screenings done by your health care provider during your annual well check will depend on your age, overall health, lifestyle risk factors, and family history of disease. Counseling Your health care provider may ask you questions about your:  Alcohol use.  Tobacco use.  Drug use.  Emotional well-being.  Home and relationship well-being.  Sexual activity.  Eating habits.  Work and work Statistician.  Method of birth control.  Menstrual cycle.  Pregnancy history.  Screening You may have the following tests or measurements:  Height, weight, and BMI.  Diabetes screening. This is done by checking your blood sugar (glucose) after you have not eaten for a while (fasting).  Blood pressure.  Lipid and cholesterol levels. These may be checked every 5 years starting at age 66.  Skin check.  Hepatitis C blood test.  Hepatitis B blood test.  Sexually transmitted disease (STD) testing.  BRCA-related cancer screening. This may be done if you have a family history of breast, ovarian, tubal, or peritoneal cancers.  Pelvic exam and Pap test. This may be done every 3 years starting at age 40. Starting at age 59, this may be done every 5  years if you have a Pap test in combination with an HPV test.  Discuss your test results, treatment options, and if necessary, the need for more tests with your health care provider. Vaccines Your health care provider may recommend certain vaccines, such as:  Influenza vaccine. This is recommended every year.  Tetanus, diphtheria, and acellular pertussis (Tdap, Td) vaccine. You may need a Td booster every 10 years.  Varicella vaccine. You may need this if you have not been vaccinated.  HPV vaccine. If you are 69 or younger, you may need three doses over 6 months.  Measles, mumps, and rubella (MMR) vaccine. You may need at least one dose of MMR. You may also need a second dose.  Pneumococcal 13-valent conjugate (PCV13) vaccine. You may need this if you have certain conditions and were not previously vaccinated.  Pneumococcal polysaccharide (PPSV23) vaccine. You may need one or two doses if you smoke cigarettes or if you have certain conditions.  Meningococcal vaccine. One dose is recommended if you are age 27-21 years and a first-year college student living in a residence hall, or if you have one of several medical conditions. You may also need additional booster doses.  Hepatitis A vaccine. You may need this if you have certain conditions or if you travel or work in places where you may be exposed to hepatitis A.  Hepatitis B vaccine. You may need this if you have certain conditions or if you travel or work in places where you may be exposed to hepatitis B.  Haemophilus influenzae type b (Hib) vaccine. You may need this if  you have certain risk factors.  Talk to your health care provider about which screenings and vaccines you need and how often you need them. This information is not intended to replace advice given to you by your health care provider. Make sure you discuss any questions you have with your health care provider. Document Released: 12/19/2001 Document Revised: 07/12/2016  Document Reviewed: 08/24/2015 Elsevier Interactive Patient Education  2018 [employer]vier [employer].  Preparing for Pregnancy If you are considering becoming pregnant, make an appointment to see your regular health care provider to learn how to prepare for a safe and healthy pregnancy (preconception care). During a preconception care visit, your health care provider will:  Do a complete physical exam, including a Pap test.  Take a complete medical history.  Give you information, answer your questions, and help you resolve problems.  Preconception checklist Medical history  Tell your health care provider about any current or past medical conditions. Your pregnancy or your ability to become pregnant may be affected by chronic conditions, such as diabetes, chronic hypertension, and thyroid problems.  Include your family's medical history as well as your partner's medical history.  Tell your health care provider about any history of STIs (sexually transmitted infections).These can affect your pregnancy. In some cases, they can be passed to your baby. Discuss any concerns that you have about STIs.  If indicated, discuss the benefits of genetic testing. This testing will show whether there are any genetic conditions that may be passed from you or your partner to your baby.  Tell your health care provider about: ? Any problems you have had with conception or pregnancy. ? Any medicines you take. These include vitamins, herbal supplements, and over-the-counter medicines. ? Your history of immunizations. Discuss any vaccinations that you may need.  Diet  Ask your health care provider what to include in a healthy diet that has a balance of nutrients. This is especially important when you are pregnant or preparing to become pregnant.  Ask your health care provider to help you reach a healthy weight before pregnancy. ? If you are overweight, you may be at higher risk for certain complications, such as high  blood pressure, diabetes, and preterm birth. ? If you are underweight, you are more likely to have a baby who has a low birth weight.  Lifestyle, work, and home  Let your health care provider know: ? About any lifestyle habits that you have, such as alcohol use, drug use, or smoking. ? About recreational activities that may put you at risk during pregnancy, such as downhill skiing and certain exercise programs. ? Tell your health care provider about any international travel, especially any travel to places with an active Zika virus outbreak. ? About harmful substances that you may be exposed to at work or at home. These include chemicals, pesticides, radiation, or even litter boxes. ? If you do not feel safe at home.  Mental health  Tell your health care provider about: ? Any history of mental health conditions, including feelings of depression, sadness, or anxiety. ? Any medicines that you take for a mental health condition. These include herbs and supplements.  Home instructions to prepare for pregnancy Lifestyle  Eat a balanced diet. This includes fresh fruits and vegetables, whole grains, lean meats, low-fat dairy products, healthy fats, and foods that are high in fiber. Ask to meet with a nutritionist or registered dietitian for assistance with meal planning and goals.  Get regular exercise. Try to be active for at   least 30 minutes a day on most days of the week. Ask your health care provider which activities are safe during pregnancy.  Do not use any products that contain nicotine or tobacco, such as cigarettes and e-cigarettes. If you need help quitting, ask your health care provider.  Do not drink alcohol.  Do not take illegal drugs.  Maintain a healthy weight. Ask your health care provider what weight range is right for you.  General instructions  Keep an accurate record of your menstrual periods. This makes it easier for your health care provider to determine your baby's  due date.  Begin taking prenatal vitamins and folic acid supplements daily as directed by your health care provider.  Manage any chronic conditions, such as high blood pressure and diabetes, as told by your health care provider. This is important.  How do I know that I am pregnant? You may be pregnant if you have been sexually active and you miss your period. Symptoms of early pregnancy include:  Mild cramping.  Very light vaginal bleeding (spotting).  Feeling unusually tired.  Nausea and vomiting (morning sickness).  If you have any of these symptoms and you suspect that you might be pregnant, you can take a home pregnancy test. These tests check for a hormone in your urine (human chorionic gonadotropin, or hCG). A woman's body begins to make this hormone during early pregnancy. These tests are very accurate. Wait until at least the first day after you miss your period to take one. If the test shows that you are pregnant (you get a positive result), call your health care provider to make an appointment for prenatal care. What should I do if I become pregnant?  Make an appointment with your health care provider as soon as you suspect you are pregnant.  Do not use any products that contain nicotine, such as cigarettes, chewing tobacco, and e-cigarettes. If you need help quitting, ask your health care provider.  Do not drink alcoholic beverages. Alcohol is related to a number of birth defects.  Avoid toxic odors and chemicals.  You may continue to have sexual intercourse if it does not cause pain or other problems, such as vaginal bleeding. This information is not intended to replace advice given to you by your health care provider. Make sure you discuss any questions you have with your health care provider. Document Released: 10/05/2008 Document Revised: 06/20/2016 Document Reviewed: 05/14/2016 Elsevier Interactive Patient Education  2018 [employer]vier Inc.  

## 2018-04-22 NOTE — Progress Notes (Signed)
Laurine BlazerKimberly Joan Caviness is a 22 y.o. year old [id number] Caucasian female who presents for removal of a Mirena IUD. Her Mirena IUD was placed 2016.   Patient's last menstrual period was 03/21/2018 (exact date). BP 102/68   Pulse 64   Ht 5\' 10"  (1.778 m)   Wt 168 lb 2 oz (76.3 kg)   LMP 03/21/2018 (Exact Date)   BMI 24.12 kg/m   Time out was performed.  A small plastic speculum was placed in the vagina.  The cervix was visualized, and the strings were visible. They were grasped and the Mirena was easily removed intact without complications.   RTC x 5 months for Annual Exam or sooner if needed.    Gunnar BullaJenkins Michelle Cheris Tweten, CNM [facility], [facility]

## 2018-07-11 ENCOUNTER — Other Ambulatory Visit: Payer: Self-pay | Admitting: Certified Nurse Midwife

## 2018-07-11 ENCOUNTER — Encounter: Payer: Self-pay | Admitting: Certified Nurse Midwife

## 2018-07-11 ENCOUNTER — Ambulatory Visit (INDEPENDENT_AMBULATORY_CARE_PROVIDER_SITE_OTHER): Payer: BLUE CROSS/BLUE SHIELD | Admitting: Certified Nurse Midwife

## 2018-07-11 ENCOUNTER — Other Ambulatory Visit (INDEPENDENT_AMBULATORY_CARE_PROVIDER_SITE_OTHER): Payer: BLUE CROSS/BLUE SHIELD

## 2018-07-11 VITALS — BP 120/77 | HR 95 | Ht 70.0 in | Wt 171.6 lb

## 2018-07-11 DIAGNOSIS — N926 Irregular menstruation, unspecified: Secondary | ICD-10-CM

## 2018-07-11 DIAGNOSIS — Z3201 Encounter for pregnancy test, result positive: Secondary | ICD-10-CM

## 2018-07-11 DIAGNOSIS — O3680X0 Pregnancy with inconclusive fetal viability, not applicable or unspecified: Secondary | ICD-10-CM | POA: Diagnosis not present

## 2018-07-11 DIAGNOSIS — O219 Vomiting of pregnancy, unspecified: Secondary | ICD-10-CM

## 2018-07-11 DIAGNOSIS — Z3A01 Less than 8 weeks gestation of pregnancy: Secondary | ICD-10-CM

## 2018-07-11 LAB — POCT URINE PREGNANCY: Preg Test, Ur: POSITIVE — AB

## 2018-07-11 MED ORDER — RANITIDINE HCL 150 MG PO TABS: 150.0000 mg | ORAL_TABLET | Freq: Two times a day (BID) | ORAL | 1 refills | Status: DC

## 2018-07-11 NOTE — Patient Instructions (Signed)
WHAT OB PATIENTS CAN EXPECT   Confirmation of pregnancy and ultrasound ordered if medically indicated-[redacted] weeks gestation  New OB (NOB) intake with nurse and New OB (NOB) labs- [redacted] weeks gestation  New OB (NOB) physical examination with provider- 11/[redacted] weeks gestation  Flu vaccine-[redacted] weeks gestation  Anatomy scan-[redacted] weeks gestation  Glucose tolerance test, blood work to test for anemia, T-dap vaccine-[redacted] weeks gestation  Vaginal swabs/cultures-STD/Group B strep-[redacted] weeks gestation  Appointments every 4 weeks until 28 weeks  Every 2 weeks from 28 weeks until 36 weeks  Weekly visits from 36 weeks until delivery  Eating Plan for Pregnant Women While you are pregnant, your body will require additional nutrition to help support your growing baby. It is recommended that you consume:  150 additional calories each day during your first trimester.  300 additional calories each day during your second trimester.  300 additional calories each day during your third trimester.  Eating a healthy, well-balanced diet is very important for your health and for your baby's health. You also have a higher need for some vitamins and minerals, such as folic acid, calcium, iron, and vitamin D. What do I need to know about eating during pregnancy?  Do not try to lose weight or go on a diet during pregnancy.  Choose healthy, nutritious foods. Choose  of a sandwich with a glass of milk instead of a candy bar or a high-calorie sugar-sweetened beverage.  Limit your overall intake of foods that have "empty calories." These are foods that have little nutritional value, such as sweets, desserts, candies, sugar-sweetened beverages, and fried foods.  Eat a variety of foods, especially fruits and vegetables.  Take a prenatal vitamin to help meet the additional needs during pregnancy, specifically for folic acid, iron, calcium, and vitamin D.  Remember to stay active. Ask your health care provider for exercise  recommendations that are specific to you.  Practice good food safety and cleanliness, such as washing your hands before you eat and after you prepare raw meat. This helps to prevent foodborne illnesses, such as listeriosis, that can be very dangerous for your baby. Ask your health care provider for more information about listeriosis. What does 150 extra calories look like? Healthy options for an additional 150 calories each day could be any of the following:  Plain low-fat yogurt (6-8 oz) with  cup of berries.  1 apple with 2 teaspoons of peanut butter.  Cut-up vegetables with  cup of hummus.  Low-fat chocolate milk (8 oz or 1 cup).  1 string cheese with 1 medium orange.   of a peanut butter and jelly sandwich on whole-wheat bread (1 tsp of peanut butter).  For 300 calories, you could eat two of those healthy options each day. What is a healthy amount of weight to gain? The recommended amount of weight for you to gain is based on your pre-pregnancy BMI. If your pre-pregnancy BMI was:  Less than 18 (underweight), you should gain 28-40 lb.  18-24.9 (normal), you should gain 25-35 lb.  25-29.9 (overweight), you should gain 15-25 lb.  Greater than 30 (obese), you should gain 11-20 lb.  What if I am having twins or multiples? Generally, pregnant women who will be having twins or multiples may need to increase their daily calories by 300-600 calories each day. The recommended range for total weight gain is 25-54 lb, depending on your pre-pregnancy BMI. Talk with your health care provider for specific guidance about additional nutritional needs, weight gain, and exercise during  your pregnancy. What foods can I eat? Grains Any grains. Try to choose whole grains, such as whole-wheat bread, oatmeal, or brown rice. Vegetables Any vegetables. Try to eat a variety of colors and types of vegetables to get a full range of vitamins and minerals. Remember to wash your vegetables well before  eating. Fruits Any fruits. Try to eat a variety of colors and types of fruit to get a full range of vitamins and minerals. Remember to wash your fruits well before eating. Meats and Other Protein Sources Lean meats, including chicken, [place], fish, and lean cuts of beef, veal, or pork. Make sure that all meats are cooked to "well done." Tofu. Tempeh. Beans. Eggs. Peanut butter and other nut butters. Seafood, such as shrimp, crab, and lobster. If you choose fish, select types that are higher in omega-3 fatty acids, including salmon, herring, mussels, trout, sardines, and pollock. Make sure that all meats are cooked to food-safe temperatures. Dairy Pasteurized milk and milk alternatives. Pasteurized yogurt and pasteurized cheese. Cottage cheese. Sour cream. Beverages Water. Juices that contain 100% fruit juice or vegetable juice. Caffeine-free teas and decaffeinated coffee. Drinks that contain caffeine are okay to drink, but it is better to avoid caffeine. Keep your total caffeine intake to less than 200 mg each day (12 oz of coffee, tea, or soda) or as directed by your health care provider. Condiments Any pasteurized condiments. Sweets and Desserts Any sweets and desserts. Fats and Oils Any fats and oils. The items listed above may not be a complete list of recommended foods or beverages. Contact your dietitian for more options. What foods are not recommended? Vegetables Unpasteurized (raw) vegetable juices. Fruits Unpasteurized (raw) fruit juices. Meats and Other Protein Sources Cured meats that have nitrates, such as bacon, salami, and hotdogs. Luncheon meats, bologna, or other deli meats (unless they are reheated until they are steaming hot). Refrigerated pate, meat spreads from a meat counter, smoked seafood that is found in the refrigerated section of a store. Raw fish, such as sushi or sashimi. High mercury content fish, such as tilefish, shark, swordfish, and king mackerel. Raw meats,  such as tuna or beef tartare. Undercooked meats and poultry. Make sure that all meats are cooked to food-safe temperatures. Dairy Unpasteurized (raw) milk and any foods that have raw milk in them. Soft cheeses, such as feta, queso blanco, queso fresco, Brie, Camembert cheeses, blue-veined cheeses, and Panela cheese (unless it is made with pasteurized milk, which must be stated on the label). Beverages Alcohol. Sugar-sweetened beverages, such as sodas, teas, or energy drinks. Condiments Homemade fermented foods and drinks, such as pickles, sauerkraut, or kombucha drinks. (Store-bought pasteurized versions of these are okay.) Other Salads that are made in the store, such as ham salad, chicken salad, egg salad, tuna salad, and seafood salad. The items listed above may not be a complete list of foods and beverages to avoid. Contact your dietitian for more information. This information is not intended to replace advice given to you by your health care provider. Make sure you discuss any questions you have with your health care provider. Document Released: 08/07/2014 Document Revised: 03/30/2016 Document Reviewed: 04/07/2014 Elsevier Interactive Patient Education  2018 [employer]. Common Medications Safe in Pregnancy  Acne:      Constipation:  Benzoyl Peroxide     Colace  Clindamycin      Dulcolax Suppository  Topica Erythromycin     Fibercon  Salicylic Acid      Metamucil  Miralax AVOID:        Senakot   Accutane    Cough:  Retin-A       Cough Drops  Tetracycline      Phenergan w/ Codeine if Rx  Minocycline      Robitussin (Plain & DM)  Antibiotics:     Crabs/Lice:  Ceclor       RID  Cephalosporins    AVOID:  E-Mycins      Kwell  Keflex  Macrobid/Macrodantin   Diarrhea:  Penicillin      Kao-Pectate  Zithromax      Imodium AD         PUSH FLUIDS AVOID:       Cipro     Fever:  Tetracycline      Tylenol (Regular or Extra  Minocycline       Strength)  Levaquin      Extra  Strength-Do not          Exceed 8 tabs/24 hrs Caffeine:        <274m/day (equiv. To 1 cup of coffee or  approx. 3 12 oz sodas)         Gas: Cold/Hayfever:       Gas-X  Benadryl      Mylicon  Claritin       Phazyme  **Claritin-D        Chlor-Trimeton    Headaches:  Dimetapp      ASA-Free Excedrin  Drixoral-Non-Drowsy     Cold Compress  Mucinex (Guaifenasin)     Tylenol (Regular or Extra  Sudafed/Sudafed-12 Hour     Strength)  **Sudafed PE Pseudoephedrine   Tylenol Cold & Sinus     Vicks Vapor Rub  Zyrtec  **AVOID if Problems With Blood Pressure         Heartburn: Avoid lying down for at least 1 hour after meals  Aciphex      Maalox     Rash:  Milk of Magnesia     Benadryl    Mylanta       1% Hydrocortisone Cream  Pepcid  Pepcid Complete   Sleep Aids:  Prevacid      Ambien   Prilosec       Benadryl  Rolaids       Chamomile Tea  Tums (Limit 4/day)     Unisom  Zantac       Tylenol PM         Warm milk-add vanilla or  Hemorrhoids:       Sugar for taste  Anusol/Anusol H.C.  (RX: Analapram 2.5%)  Sugar Substitutes:  Hydrocortisone OTC     Ok in moderation  Preparation H      Tucks        Vaseline lotion applied to tissue with wiping    Herpes:     Throat:  Acyclovir      Oragel  Famvir  Valtrex     Vaccines:         Flu Shot Leg Cramps:       *Gardasil  Benadryl      Hepatitis A         Hepatitis B Nasal Spray:       Pneumovax  Saline Nasal Spray     Polio Booster         Tetanus Nausea:       Tuberculosis test or PPD  Vitamin B6 25 mg TID   AVOID:    Dramamine      *  Gardasil  Emetrol       Live Poliovirus  Ginger Root 250 mg QID    MMR (measles, mumps &  High Complex Carbs @ Bedtime    rebella)  Sea Bands-Accupressure    Varicella (Chickenpox)  Unisom 1/2 tab TID     *No known complications           If received before Pain:         Known pregnancy;   Darvocet       Resume series  after  Lortab        Delivery  Percocet    Yeast:   Tramadol      Femstat  Tylenol 3      Gyne-lotrimin  Ultram       Monistat  Vicodin           MISC:         All Sunscreens           Hair Coloring/highlights          Insect Repellant's          (Including DEET)         Mystic Tans Morning Sickness Morning sickness is when you feel sick to your stomach (nauseous) during pregnancy. You may feel sick to your stomach and throw up (vomit). You may feel sick in the morning, but you can feel this way any time of day. Some women feel very sick to their stomach and cannot stop throwing up (hyperemesis gravidarum). Follow these instructions at home:  Only take medicines as told by your doctor.  Take multivitamins as told by your doctor. Taking multivitamins before getting pregnant can stop or lessen the harshness of morning sickness.  Eat dry toast or unsalted crackers before getting out of bed.  Eat 5 to 6 small meals a day.  Eat dry and bland foods like rice and baked potatoes.  Do not drink liquids with meals. Drink between meals.  Do not eat greasy, fatty, or spicy foods.  Have someone cook for you if the smell of food causes you to feel sick or throw up.  If you feel sick to your stomach after taking prenatal vitamins, take them at night or with a snack.  Eat protein when you need a snack (nuts, yogurt, cheese).  Eat unsweetened gelatins for dessert.  Wear a bracelet used for sea sickness (acupressure wristband).  Go to a doctor that puts thin needles into certain body points (acupuncture) to improve how you feel.  Do not smoke.  Use a humidifier to keep the air in your house free of odors.  Get lots of fresh air. Contact a doctor if:  You need medicine to feel better.  You feel dizzy or lightheaded.  You are losing weight. Get help right away if:  You feel very sick to your stomach and cannot stop throwing up.  You pass out (faint). This information is not  intended to replace advice given to you by your health care provider. Make sure you discuss any questions you have with your health care provider. Document Released: 11/30/2004 Document Revised: 03/30/2016 Document Reviewed: 04/09/2013 Elsevier Interactive Patient Education  2017 [place] of Pregnancy The first trimester of pregnancy is from week 1 until the end of week 13 (months 1 through 3). During this time, your baby will begin to develop inside you. At 6-8 weeks, the eyes and face are formed, and the heartbeat can be seen on ultrasound. At the  end of 12 weeks, all the baby's organs are formed. Prenatal care is all the medical care you receive before the birth of your baby. Make sure you get good prenatal care and follow all of your doctor's instructions. Follow these instructions at home: Medicines  Take over-the-counter and prescription medicines only as told by your doctor. Some medicines are safe and some medicines are not safe during pregnancy.  Take a prenatal vitamin that contains at least 600 micrograms (mcg) of folic acid.  If you have trouble pooping (constipation), take medicine that will make your stool soft (stool softener) if your doctor approves. Eating and drinking  Eat regular, healthy meals.  Your doctor will tell you the amount of weight gain that is right for you.  Avoid raw meat and uncooked cheese.  If you feel sick to your stomach (nauseous) or throw up (vomit): ? Eat 4 or 5 small meals a day instead of 3 large meals. ? Try eating a few soda crackers. ? Drink liquids between meals instead of during meals.  To prevent constipation: ? Eat foods that are high in fiber, like fresh fruits and vegetables, whole grains, and beans. ? Drink enough fluids to keep your pee (urine) clear or pale yellow. Activity  Exercise only as told by your doctor. Stop exercising if you have cramps or pain in your lower belly (abdomen) or low back.  Do not  exercise if it is too hot, too humid, or if you are in a place of great height (high altitude).  Try to avoid standing for long periods of time. Move your legs often if you must stand in one place for a long time.  Avoid heavy lifting.  Wear low-heeled shoes. Sit and stand up straight.  You can have sex unless your doctor tells you not to. Relieving pain and discomfort  Wear a good support bra if your breasts are sore.  Take warm water baths (sitz baths) to soothe pain or discomfort caused by hemorrhoids. Use hemorrhoid cream if your doctor says it is okay.  Rest with your legs raised if you have leg cramps or low back pain.  If you have puffy, bulging veins (varicose veins) in your legs: ? Wear support hose or compression stockings as told by your doctor. ? Raise (elevate) your feet for 15 minutes, 3-4 times a day. ? Limit salt in your food. Prenatal care  Schedule your prenatal visits by the twelfth week of pregnancy.  Write down your questions. Take them to your prenatal visits.  Keep all your prenatal visits as told by your doctor. This is important. Safety  Wear your seat belt at all times when driving.  Make a list of emergency phone numbers. The list should include numbers for family, friends, the hospital, and police and fire departments. General instructions  Ask your doctor for a referral to a local prenatal class. Begin classes no later than at the start of month 6 of your pregnancy.  Ask for help if you need counseling or if you need help with nutrition. Your doctor can give you advice or tell you where to go for help.  Do not use hot tubs, steam rooms, or saunas.  Do not douche or use tampons or scented sanitary pads.  Do not cross your legs for long periods of time.  Avoid all herbs and alcohol. Avoid drugs that are not approved by your doctor.  Do not use any tobacco products, including cigarettes, chewing tobacco, and electronic cigarettes. If  you need  help quitting, ask your doctor. You may get counseling or other support to help you quit.  Avoid cat litter boxes and soil used by cats. These carry germs that can cause birth defects in the baby and can cause a loss of your baby (miscarriage) or stillbirth.  Visit your dentist. At home, brush your teeth with a soft toothbrush. Be gentle when you floss. Contact a doctor if:  You are dizzy.  You have mild cramps or pressure in your lower belly.  You have a nagging pain in your belly area.  You continue to feel sick to your stomach, you throw up, or you have watery poop (diarrhea).  You have a bad smelling fluid coming from your vagina.  You have pain when you pee (urinate).  You have increased puffiness (swelling) in your face, hands, legs, or ankles. Get help right away if:  You have a fever.  You are leaking fluid from your vagina.  You have spotting or bleeding from your vagina.  You have very bad belly cramping or pain.  You gain or lose weight rapidly.  You throw up blood. It may look like coffee grounds.  You are around people who have [place] measles, fifth disease, or chickenpox.  You have a very bad headache.  You have shortness of breath.  You have any kind of trauma, such as from a fall or a car accident. Summary  The first trimester of pregnancy is from week 1 until the end of week 13 (months 1 through 3).  To take care of yourself and your unborn baby, you will need to eat healthy meals, take medicines only if your doctor tells you to do so, and do activities that are safe for you and your baby.  Keep all follow-up visits as told by your doctor. This is important as your doctor will have to ensure that your baby is healthy and growing well. This information is not intended to replace advice given to you by your health care provider. Make sure you discuss any questions you have with your health care provider. Document Released: 04/10/2008 Document Revised:  10/31/2016 Document Reviewed: 10/31/2016 Elsevier Interactive Patient Education  2017 [employer].

## 2018-07-11 NOTE — Progress Notes (Signed)
GYN ENCOUNTER NOTE  Subjective:       Abigail Parker is a 22 y.o. G67P0000 female here for pregnancy confirmation.   Reports three positive home pregnancy tests. Endorses nausea with vomiting, food aversions, heartburn and indigestion, breast tenderness, abdominal cramping, and frequent urination.   Denies difficulty breathing or respiratory distress, chest pain, dysuria, vaginal bleeding, and leg pain or swelling.   She is taking a prenatal vitamin. This was a planned pregnancy.    Gynecologic History  Patient's last menstrual period was 05/27/2018.   Contraception: none  Last Pap: due.   Obstetric History  OB History  Gravida Para Term Preterm AB Living  1 0 0 0 0 0  SAB TAB Ectopic Multiple Live Births  0 0 0 0 0    # Outcome Date GA Lbr Len/2nd Weight Sex Delivery Anes PTL Lv  1 Current             Past Medical History:  Diagnosis Date  . Anxiety   . Depression   . PTSD (post-traumatic stress disorder)     Past Surgical History:  Procedure Laterality Date  . KNEE SURGERY Left   . WISDOM TOOTH EXTRACTION      Current Outpatient Medications on File Prior to Visit  Medication Sig Dispense Refill  . ibuprofen (ADVIL,MOTRIN) 600 MG tablet Take 1 tablet (600 mg total) by mouth every 8 (eight) hours as needed. (Patient not taking: Reported on 07/11/2018) 15 tablet 0   No current facility-administered medications on file prior to visit.     Allergies  Allergen Reactions  . Norco [Hydrocodone-Acetaminophen] Hives  . Norco [Hydrocodone-Acetaminophen] Hives    Social History   Socioeconomic History  . Marital status: Single    Spouse name: Not on file  . Number of children: Not on file  . Years of education: Not on file  . Highest education level: Not on file  Occupational History  . Not on file  Social Needs  . Financial resource strain: Not on file  . Food insecurity:    Worry: Not on file    Inability: Not on file  . Transportation needs:   Medical: Not on file    Non-medical: Not on file  Tobacco Use  . Smoking status: Former Games developer  . Smokeless tobacco: Never Used  Substance and Sexual Activity  . Alcohol use: No  . Drug use: No  . Sexual activity: Yes  Lifestyle  . Physical activity:    Days per week: Not on file    Minutes per session: Not on file  . Stress: Not on file  Relationships  . Social connections:    Talks on phone: Not on file    Gets together: Not on file    Attends religious service: Not on file    Active member of club or organization: Not on file    Attends meetings of clubs or organizations: Not on file    Relationship status: Not on file  . Intimate partner violence:    Fear of current or ex partner: Not on file    Emotionally abused: Not on file    Physically abused: Not on file    Forced sexual activity: Not on file  Other Topics Concern  . Not on file  Social History Narrative   ** Merged History Encounter **        Family History  Problem Relation Age of Onset  . Hypertension Mother   . Depression Mother   .  Anxiety disorder Mother   . Ovarian cancer Paternal Grandmother   . Diabetes Paternal Grandmother   . Breast cancer Paternal Grandmother     The following portions of the patient's history were reviewed and updated as appropriate: allergies, current medications, past family history, past medical history, past social history, past surgical history and problem list.  Review of Systems  ROS negative except as noted above. Information obtained from patient.   Objective:   BP 120/77   Pulse 95   Ht 5\' 10"  (1.778 m)   Wt 171 lb 9.6 oz (77.8 kg)   LMP 05/27/2018   BMI 24.62 kg/m   GENERAL: Alert and oriented x 4, no apparent distress.   POSITIVE URINE PREGNANCY TEST  ULTRASOUND REPORT  Location: [facility] Date of Service:  07/11/2018  Indications: Dating/Viability Findings:  Mason Jim intrauterine pregnancy is visualized with a CRL consistent with 6  5/[redacted] weeks gestation, giving an (U/S) EDD of 03/01/19. The (U/S) EDD is consistent with the clinically established (LMP) EDD of 03/03/19.  FHR: 124 BPM CRL measurement: 7.5 mm Yolk sac and early anatomy is normal.  Right Ovary measures 3.4 x 2.1 x 2.4 cm. It is normal in appearance. Left Ovary measures 2.4 x 1.8 x 1.3 cm. It is normal appearance. There is no obvious evidence of a corpus luteal cyst. Survey of the adnexa demonstrates no adnexal masses. There is no free peritoneal fluid in the cul de sac.  Impression: 1. 6 5/7 week Viable Singleton Intrauterine pregnancy by U/S. 2. (U/S) EDD is consistent with Clinically established (LMP) EDD of 03/03/19.  Recommendations: 1.Clinical correlation with the patient's History and Physical Exam.   Assessment:   1. Missed menses  - POCT urine pregnancy  2. Nausea/vomiting in pregnancy   Plan:   Ultrasound findings reviewed with patient and spouse, verbalized understanding.   Bonjesta samples given.   Rx: Zantac, see orders.   First trimester education; see AVS.   Reviewed red flag symptoms and when to call.   RTC x 3 weeks for Nurse Intake. RTC x 6 weeks for NOB with me or sooner if needed.    Gunnar Bulla, CNM [facility], [facility]

## 2018-07-16 ENCOUNTER — Other Ambulatory Visit: Payer: Self-pay

## 2018-07-16 ENCOUNTER — Encounter: Payer: Self-pay | Admitting: Emergency Medicine

## 2018-07-16 ENCOUNTER — Ambulatory Visit
Admission: EM | Admit: 2018-07-16 | Discharge: 2018-07-16 | Disposition: A | Discharge status: Home / Self Care | Payer: BLUE CROSS/BLUE SHIELD | Attending: Family Medicine | Admitting: Family Medicine

## 2018-07-16 DIAGNOSIS — R112 Nausea with vomiting, unspecified: Secondary | ICD-10-CM | POA: Diagnosis not present

## 2018-07-16 DIAGNOSIS — Z331 Pregnant state, incidental: Secondary | ICD-10-CM | POA: Diagnosis not present

## 2018-07-16 DIAGNOSIS — O219 Vomiting of pregnancy, unspecified: Secondary | ICD-10-CM

## 2018-07-16 LAB — COMPREHENSIVE METABOLIC PANEL
ALBUMIN: 4.3 g/dL (ref 3.5–5.0)
ALK PHOS: 42 U/L (ref 38–126)
ALT: 23 U/L (ref 0–44)
AST: 19 U/L (ref 15–41)
Anion gap: 7 (ref 5–15)
BILIRUBIN TOTAL: 0.6 mg/dL (ref 0.3–1.2)
BUN: 7 mg/dL (ref 6–20)
CO2: 23 mmol/L (ref 22–32)
CREATININE: 0.5 mg/dL (ref 0.44–1.00)
Calcium: 9.1 mg/dL (ref 8.9–10.3)
Chloride: 107 mmol/L (ref 98–111)
GFR calc Af Amer: >60 mL/min (ref >60)
GFR calc non Af Amer: >60 mL/min (ref >60)
GLUCOSE: 89 mg/dL (ref 70–99)
Potassium: 4 mmol/L (ref 3.5–5.1)
SODIUM: 137 mmol/L (ref 135–145)
TOTAL PROTEIN: 7.1 g/dL (ref 6.5–8.1)

## 2018-07-16 LAB — CBC WITH DIFFERENTIAL/PLATELET
BASOS PCT: 0 %
Basophils Absolute: 0 10*3/uL (ref 0–0.1)
EOS ABS: 0 10*3/uL (ref 0–0.7)
Eosinophils Relative: 0 %
HEMATOCRIT: 42.4 % (ref 35.0–47.0)
Hemoglobin: 14.4 g/dL (ref 12.0–16.0)
LYMPHS ABS: 2 10*3/uL (ref 1.0–3.6)
Lymphocytes Relative: 20 %
MCH: 31.4 pg (ref 26.0–34.0)
MCHC: 34 g/dL (ref 32.0–36.0)
MCV: 92.4 fL (ref 80.0–100.0)
MONOS PCT: 7 %
Monocytes Absolute: 0.7 10*3/uL (ref 0.2–0.9)
NEUTROS ABS: 7.4 10*3/uL — AB (ref 1.4–6.5)
NEUTROS PCT: 73 %
Platelets: 262 10*3/uL (ref 150–440)
RBC: 4.59 MIL/uL (ref 3.80–5.20)
RDW: 12.5 % (ref 11.5–14.5)
WBC: 10.2 10*3/uL (ref 3.6–11.0)

## 2018-07-16 MED ORDER — ONDANSETRON 4 MG PO TBDP: 4.0000 mg | ORAL_TABLET | Freq: Three times a day (TID) | ORAL | 0 refills | Status: DC | PRN

## 2018-07-16 MED ORDER — SODIUM CHLORIDE 0.9 % IV BOLUS
1000.0000 mL | Freq: Once | INTRAVENOUS | Status: AC
  Administered 2018-07-16: 1000 mL via INTRAVENOUS

## 2018-07-16 MED ORDER — RANITIDINE HCL 150 MG PO TABS: 150.0000 mg | ORAL_TABLET | Freq: Two times a day (BID) | ORAL | 1 refills | Status: DC

## 2018-07-16 NOTE — Discharge Instructions (Signed)
Continue Bonjesta.  Continue Zantac.  Zofran (if needed).  Call Ob.  Take care  Dr. Adriana Simas

## 2018-07-16 NOTE — ED Triage Notes (Signed)
Patient stated she has had nausea and vomiting x 3 weeks. She states she is unable to keep anything down. She stated her OB gave her Bonjesta for morning sickness that she has been taking for 1 week. Patient states it helped for a few days.

## 2018-07-16 NOTE — ED Provider Notes (Signed)
[facility]    CSN: [id number] Arrival date & time: 07/16/18  1220   History   Chief Complaint Chief Complaint  Patient presents with  . Emesis   HPI   22 year old female who is currently [redacted] weeks pregnant presents with nausea and vomiting.  She reports a 2.5 week history of nausea and vomiting.  She has seen her OB as of 9/5.  She was placed on Bonjesta.  Patient reports that she continues to have persistent nausea and vomiting.  Occurs frequently.  Worse in the morning and worse at night.  She is having difficulty keeping down her medicine as well as solids and liquids.  She has tolerated some potato soup today.  No reports of abdominal pain.  No other associated symptoms.  Patient does note that she had some improvement initially with the medication but it has been worse recently.  No other complaints at this time.  PMH, Surgical Hx, Family Hx, Social History reviewed and updated as below.  Past Medical History:  Diagnosis Date  . Anxiety   . Depression   . PTSD (post-traumatic stress disorder)     Past Surgical History:  Procedure Laterality Date  . KNEE SURGERY Left   . WISDOM TOOTH EXTRACTION      OB History    Gravida  1   Para  0   Term  0   Preterm  0   AB  0   Living  0     SAB  0   TAB  0   Ectopic  0   Multiple  0   Live Births  0          Family History Family History  Problem Relation Age of Onset  . Hypertension Mother   . Depression Mother   . Anxiety disorder Mother   . Ovarian cancer Paternal Grandmother   . Diabetes Paternal Grandmother   . Breast cancer Paternal Grandmother     Social History Social History   Tobacco Use  . Smoking status: Former Games developer  . Smokeless tobacco: Never Used  Substance Use Topics  . Alcohol use: No  . Drug use: No     Allergies   Norco [hydrocodone-acetaminophen] and Norco [hydrocodone-acetaminophen]   Review of Systems Review of Systems  Constitutional: Negative.     Gastrointestinal: Positive for nausea and vomiting.   Physical Exam Triage Vital Signs ED Triage Vitals  Enc Vitals Group     BP 07/16/18 1245 117/69     Pulse Rate 07/16/18 1245 76     Resp 07/16/18 1245 16     Temp 07/16/18 1245 98.6 F (37 C)     Temp Source 07/16/18 1245 Oral     SpO2 07/16/18 1245 100 %     Weight 07/16/18 1246 171 lb (77.6 kg)     Height 07/16/18 1246 5\' 10"  (1.778 m)     Head Circumference --      Peak Flow --      Pain Score 07/16/18 1246 0     Pain Loc --      Pain Edu? --      Excl. in GC? --    Orthostatic VS for the past 24 hrs:  BP- Lying Pulse- Lying BP- Sitting Pulse- Sitting BP- Standing at 0 minutes Pulse- Standing at 0 minutes  07/16/18 1250 102/56 63 107/58 77 98/72 97    Updated Vital Signs BP 117/69 (BP Location: Left Arm)   Pulse 76  Temp 98.6 F (37 C) (Oral)   Resp 16   Ht 5\' 10"  (1.778 m)   Wt 77.6 kg   LMP 05/27/2018   SpO2 100%   BMI 24.54 kg/m   Visual Acuity Right Eye Distance:   Left Eye Distance:   Bilateral Distance:    Right Eye Near:   Left Eye Near:    Bilateral Near:     Physical Exam  Constitutional: She is oriented to person, place, and time. She appears well-developed. No distress.  HENT:  Head: Normocephalic and atraumatic.  Mouth/Throat: Oropharynx is clear and moist.  Cardiovascular: Normal rate and regular rhythm.  Pulmonary/Chest: Effort normal and breath sounds normal. She has no wheezes. She has no rales.  Abdominal: Soft. She exhibits no distension. There is no tenderness.  Neurological: She is alert and oriented to person, place, and time.  Psychiatric: She has a normal mood and affect. Her behavior is normal.  Nursing note and vitals reviewed.  UC Treatments / Results  Labs (all labs ordered are listed, but only abnormal results are displayed) Labs Reviewed  CBC WITH DIFFERENTIAL/PLATELET - Abnormal; Notable for the following components:      Result Value   Neutro Abs 7.4 (*)     All other components within normal limits  COMPREHENSIVE METABOLIC PANEL    EKG None  Radiology No results found.  Procedures Procedures (including critical care time)  Medications Ordered in UC Medications  sodium chloride 0.9 % bolus 1,000 mL (1,000 mLs Intravenous New Bag/Given 07/16/18 1337)    Initial Impression / Assessment and Plan / UC Course  I have reviewed the triage vital signs and the nursing notes.  Pertinent labs & imaging results that were available during my care of the patient were reviewed by me and considered in my medical decision making (see chart for details).    22 year old female presents with persistent nausea and vomiting in pregnancy.  IV fluids given today.  Advised continued use of Zantac and Bonjesta.  I gave her a prescription for Zofran to be used as needed.  We discussed potential fetal cardiac effects.  Patient to follow-up closely with her OB/GYN.  Final Clinical Impressions(s) / UC Diagnoses   Final diagnoses:  Nausea/vomiting in pregnancy     Discharge Instructions     Continue Bonjesta.  Continue Zantac.  Zofran (if needed).  Call Ob.  Take care  Dr. Adriana Simas    ED Prescriptions    Medication Sig Dispense Auth. Provider   ranitidine (ZANTAC) 150 MG tablet Take 1 tablet (150 mg total) by mouth 2 (two) times daily. 60 tablet Jeily Guthridge G, DO   ondansetron (ZOFRAN-ODT) 4 MG disintegrating tablet Take 1 tablet (4 mg total) by mouth every 8 (eight) hours as needed for nausea or vomiting. 20 tablet Tommie Sams, DO     Controlled Substance Prescriptions [place] Controlled Substance Registry consulted? Not Applicable   Tommie Sams, DO 07/16/18 2683

## 2018-07-19 ENCOUNTER — Telehealth: Payer: Self-pay | Admitting: Certified Nurse Midwife

## 2018-07-19 ENCOUNTER — Other Ambulatory Visit: Payer: Self-pay

## 2018-07-19 MED ORDER — BONJESTA 20-20 MG PO TBCR: 1.0000 | EXTENDED_RELEASE_TABLET | Freq: Two times a day (BID) | ORAL | 4 refills | Status: DC

## 2018-07-19 NOTE — Telephone Encounter (Signed)
[facility] [place]. Patient also aware she can take Vit B6 3x per day and Unisom 1/2 tab at bed time until her script is ready. Thanks

## 2018-07-24 ENCOUNTER — Telehealth: Payer: Self-pay

## 2018-07-24 NOTE — Telephone Encounter (Signed)
Pt called to report she has had some spotting x 1. States she has had some mild cramping. Advised to put a pad on, stay hydrated and take it easy. Requested she let us know if bleeding or cramping becomes worse. Last intercourse about a week.

## 2018-07-24 NOTE — Telephone Encounter (Signed)
Spoke with pt- she states the bleeding has stopped but she still has slight cramping. Advised to call back if bleeding returns or cramping gets worse. Encouraged to rest and drink a lot of fluids. Pt understood and will comply.

## 2018-07-24 NOTE — Telephone Encounter (Signed)
The patient called back as requested and stated her bleeding had stopped and her cramping was better but still persistent.  She stated her bladder was really full when she got up and as she emptied it she had some cramping and stated she noticed it was some better after voiding.  She was told the nurse would contact her today at [phone], please advise, thanks.

## 2018-07-26 ENCOUNTER — Ambulatory Visit: Payer: BLUE CROSS/BLUE SHIELD | Admitting: Certified Nurse Midwife

## 2018-07-26 VITALS — BP 107/70 | HR 91 | Wt 169.1 lb

## 2018-07-26 DIAGNOSIS — Z3401 Encounter for supervision of normal first pregnancy, first trimester: Secondary | ICD-10-CM

## 2018-07-26 NOTE — Progress Notes (Signed)
Jake MichaelisKimberly Joan Caviness presents for NOB nurse interview visit. Pregnancy confirmation done 07/11/18 [facility]______.  G-1 .  P-  0  . Pregnancy education material explained and given. _0__ cats in the home. NOB labs ordered. HIV labs and Drug screen were explained optional and she did not decline. Drug screen ordered PNV encouraged. Genetic screening options discussed. Genetic testing: Declined.  Pt may discuss with provider. Pt. To follow up with provider in _4_ weeks for NOB physical.  All questions answered.

## 2018-07-26 NOTE — Progress Notes (Signed)
I have reviewed the record and concur with patient management and plan of care.    Curlee Bogan Michelle Baptiste Littler, CNM [facility], [facility] 

## 2018-07-27 LAB — URINALYSIS, ROUTINE W REFLEX MICROSCOPIC
Bilirubin, UA: NEGATIVE
GLUCOSE, UA: NEGATIVE
Leukocytes, UA: NEGATIVE
NITRITE UA: NEGATIVE
PROTEIN UA: NEGATIVE
RBC, UA: NEGATIVE
SPEC GRAV UA: 1.016 (ref 1.005–1.030)
UUROB: 0.2 mg/dL (ref 0.2–1.0)
pH, UA: 6.5 (ref 5.0–7.5)

## 2018-07-27 LAB — CBC WITH DIFFERENTIAL
BASOS ABS: 0 10*3/uL (ref 0.0–0.2)
Basos: 0 %
EOS (ABSOLUTE): 0.1 10*3/uL (ref 0.0–0.4)
EOS: 1 %
Hematocrit: 41.7 % (ref 34.0–46.6)
Hemoglobin: 14.4 g/dL (ref 11.1–15.9)
Immature Grans (Abs): 0 10*3/uL (ref 0.0–0.1)
Immature Granulocytes: 0 %
LYMPHS: 21 %
Lymphocytes Absolute: 2.5 10*3/uL (ref 0.7–3.1)
MCH: 31.2 pg (ref 26.6–33.0)
MCHC: 34.5 g/dL (ref 31.5–35.7)
MCV: 91 fL (ref 79–97)
MONOCYTES: 8 %
Monocytes Absolute: 0.9 10*3/uL (ref 0.1–0.9)
NEUTROS ABS: 8.4 10*3/uL — AB (ref 1.4–7.0)
Neutrophils: 70 %
RBC: 4.61 x10E6/uL (ref 3.77–5.28)
RDW: 12.1 % — AB (ref 12.3–15.4)
WBC: 12 10*3/uL — ABNORMAL HIGH (ref 3.4–10.8)

## 2018-07-27 LAB — RPR: RPR Ser Ql: NONREACTIVE

## 2018-07-27 LAB — ABO AND RH: RH TYPE: POSITIVE

## 2018-07-27 LAB — HIV ANTIBODY (ROUTINE TESTING W REFLEX): HIV SCREEN 4TH GENERATION: NONREACTIVE

## 2018-07-27 LAB — VARICELLA ZOSTER ANTIBODY, IGG: Varicella zoster IgG: <135 index — ABNORMAL LOW (ref >165)

## 2018-07-27 LAB — ANTIBODY SCREEN: ANTIBODY SCREEN: NEGATIVE

## 2018-07-27 LAB — HEPATITIS B SURFACE ANTIGEN: HEP B S AG: NEGATIVE

## 2018-07-27 LAB — RUBELLA SCREEN: RUBELLA: 1.19 {index} (ref >0.99)

## 2018-07-28 LAB — URINE CULTURE

## 2018-07-29 ENCOUNTER — Encounter: Payer: Self-pay | Admitting: Certified Nurse Midwife

## 2018-07-29 DIAGNOSIS — Z2839 Other underimmunization status: Secondary | ICD-10-CM | POA: Insufficient documentation

## 2018-07-29 DIAGNOSIS — O09899 Supervision of other high risk pregnancies, unspecified trimester: Secondary | ICD-10-CM | POA: Insufficient documentation

## 2018-07-29 DIAGNOSIS — Z283 Underimmunization status: Secondary | ICD-10-CM

## 2018-07-29 LAB — MONITOR DRUG PROFILE 14(MW)
Amphetamine Scrn, Ur: NEGATIVE ng/mL
BARBITURATE SCREEN URINE: NEGATIVE ng/mL
BENZODIAZEPINE SCREEN, URINE: NEGATIVE ng/mL
Buprenorphine, Urine: NEGATIVE ng/mL
CANNABINOIDS UR QL SCN: NEGATIVE ng/mL
Cocaine (Metab) Scrn, Ur: NEGATIVE ng/mL
Creatinine(Crt), U: 74.4 mg/dL (ref 20.0–300.0)
Fentanyl, Urine: NEGATIVE pg/mL
METHADONE SCREEN, URINE: NEGATIVE ng/mL
Meperidine Screen, Urine: NEGATIVE ng/mL
OPIATE SCREEN URINE: NEGATIVE ng/mL
OXYCODONE+OXYMORPHONE UR QL SCN: NEGATIVE ng/mL
Ph of Urine: 6.6 (ref 4.5–8.9)
Phencyclidine Qn, Ur: NEGATIVE ng/mL
Propoxyphene Scrn, Ur: NEGATIVE ng/mL
SPECIFIC GRAVITY: 1.015
Tramadol Screen, Urine: NEGATIVE ng/mL

## 2018-07-29 LAB — GC/CHLAMYDIA PROBE AMP
Chlamydia trachomatis, NAA: NEGATIVE
Neisseria gonorrhoeae by PCR: NEGATIVE

## 2018-07-29 LAB — NICOTINE SCREEN, URINE: COTININE UR QL SCN: NEGATIVE ng/mL

## 2018-08-02 ENCOUNTER — Telehealth: Payer: Self-pay

## 2018-08-02 NOTE — Telephone Encounter (Signed)
Attempted to contact pt- recording states " you have reached a number that is no longer in service."

## 2018-08-12 ENCOUNTER — Other Ambulatory Visit (HOSPITAL_COMMUNITY)
Admission: RE | Admit: 2018-08-12 | Discharge: 2018-08-12 | Disposition: A | Discharge status: Home / Self Care | Payer: BLUE CROSS/BLUE SHIELD | Source: Ambulatory Visit | Attending: Obstetrics and Gynecology | Admitting: Obstetrics and Gynecology

## 2018-08-12 ENCOUNTER — Ambulatory Visit (INDEPENDENT_AMBULATORY_CARE_PROVIDER_SITE_OTHER): Payer: BLUE CROSS/BLUE SHIELD | Admitting: Certified Nurse Midwife

## 2018-08-12 ENCOUNTER — Encounter: Payer: Self-pay | Admitting: Certified Nurse Midwife

## 2018-08-12 VITALS — BP 111/71 | HR 88 | Wt 169.1 lb

## 2018-08-12 DIAGNOSIS — Z283 Underimmunization status: Secondary | ICD-10-CM

## 2018-08-12 DIAGNOSIS — Z3401 Encounter for supervision of normal first pregnancy, first trimester: Secondary | ICD-10-CM | POA: Diagnosis not present

## 2018-08-12 DIAGNOSIS — Z3A11 11 weeks gestation of pregnancy: Secondary | ICD-10-CM

## 2018-08-12 DIAGNOSIS — Z3481 Encounter for supervision of other normal pregnancy, first trimester: Secondary | ICD-10-CM | POA: Insufficient documentation

## 2018-08-12 DIAGNOSIS — Z3480 Encounter for supervision of other normal pregnancy, unspecified trimester: Secondary | ICD-10-CM | POA: Diagnosis present

## 2018-08-12 DIAGNOSIS — O09899 Supervision of other high risk pregnancies, unspecified trimester: Secondary | ICD-10-CM

## 2018-08-12 LAB — POCT URINALYSIS DIPSTICK OB
Bilirubin, UA: NEGATIVE
Blood, UA: NEGATIVE
Glucose, UA: NEGATIVE
Ketones, UA: NEGATIVE
LEUKOCYTES UA: NEGATIVE
Nitrite, UA: NEGATIVE
PH UA: 6.5 (ref 5.0–8.0)
Spec Grav, UA: >=1.03 — AB (ref 1.010–1.025)
Urobilinogen, UA: 0.2 E.U./dL

## 2018-08-12 NOTE — Progress Notes (Addendum)
NEW OB HISTORY AND PHYSICAL  SUBJECTIVE:       Abigail Parker is a 22 y.o. [id number] female, Patient's last menstrual period was 05/27/2018., Estimated Date of Delivery: 03/01/19, [redacted]w[redacted]d, presents today for establishment of Prenatal Care.  She has no unusual complaints. Endorses nausea with occasional vomiting. Taking vitamin B6 as needed  Denies difficulty breathing or respiratory distress, chest pain, abdominal pain, excessive vaginal bleeding, dysuria, and leg pain or swelling.    Plans on touring birth center in [place] to explore options for out of hospital birth.    Gynecologic History  Patient's last menstrual period was 05/27/2018.   Contraception: none   Last Pap: due.   Obstetric History  OB History  Gravida Para Term Preterm AB Living  1 0 0 0 0 0  SAB TAB Ectopic Multiple Live Births  0 0 0 0 0    # Outcome Date GA Lbr Len/2nd Weight Sex Delivery Anes PTL Lv  1 Current             Past Medical History:  Diagnosis Date  . Anxiety   . Depression   . Kidney stone   . PTSD (post-traumatic stress disorder)     Past Surgical History:  Procedure Laterality Date  . KNEE SURGERY Left   . WISDOM TOOTH EXTRACTION      Current Outpatient Medications on File Prior to Visit  Medication Sig Dispense Refill  . Prenatal Multivit-Min-Fe-FA (PRE-NATAL FORMULA) TABS Take by mouth.    . ranitidine (ZANTAC) 150 MG tablet Take 1 tablet (150 mg total) by mouth 2 (two) times daily. 60 tablet 1   No current facility-administered medications on file prior to visit.     Allergies  Allergen Reactions  . Norco [Hydrocodone-Acetaminophen] Hives  . Norco [Hydrocodone-Acetaminophen] Hives    Social History   Socioeconomic History  . Marital status: Single    Spouse name: Not on file  . Number of children: Not on file  . Years of education: Not on file  . Highest education level: Not on file  Occupational History  . Not on file  Social Needs  . Financial  resource strain: Not on file  . Food insecurity:    Worry: Not on file    Inability: Not on file  . Transportation needs:    Medical: Not on file    Non-medical: Not on file  Tobacco Use  . Smoking status: Former Games developer  . Smokeless tobacco: Never Used  Substance and Sexual Activity  . Alcohol use: No  . Drug use: No  . Sexual activity: Yes  Lifestyle  . Physical activity:    Days per week: Not on file    Minutes per session: Not on file  . Stress: Not on file  Relationships  . Social connections:    Talks on phone: Not on file    Gets together: Not on file    Attends religious service: Not on file    Active member of club or organization: Not on file    Attends meetings of clubs or organizations: Not on file    Relationship status: Not on file  . Intimate partner violence:    Fear of current or ex partner: Not on file    Emotionally abused: Not on file    Physically abused: Not on file    Forced sexual activity: Not on file  Other Topics Concern  . Not on file  Social History Narrative   ** Merged History  Encounter **        Family History  Problem Relation Age of Onset  . Hypertension Mother   . Depression Mother   . Anxiety disorder Mother   . Ovarian cancer Paternal Grandmother   . Diabetes Paternal Grandmother   . Breast cancer Paternal Grandmother     The following portions of the patient's history were reviewed and updated as appropriate: allergies, current medications, past OB history, past medical history, past surgical history, past family history, past social history, and problem list.  OBJECTIVE:   BP 111/71   Pulse 88   Wt 169 lb 1.6 oz (76.7 kg)   LMP 05/27/2018   BMI 24.26 kg/m   Initial Physical Exam (New OB)  GENERAL APPEARANCE: alert, well appearing, in no apparent distress  HEAD: normocephalic, atraumatic  MOUTH: mucous membranes moist, pharynx normal without lesions  THYROID: no thyromegaly or masses present  BREASTS: no  masses noted, no significant tenderness, no palpable axillary nodes, no skin changes  LUNGS: clear to auscultation, no wheezes, rales or rhonchi, symmetric air entry  HEART: regular rate and rhythm, no murmurs  ABDOMEN: soft, nontender, nondistended, no abnormal masses, no epigastric pain and FHT present  EXTREMITIES: no redness or tenderness in the calves or thighs, no edema  SKIN: normal coloration and turgor, no rashes  LYMPH NODES: no adenopathy palpable  NEUROLOGIC: alert, oriented, normal speech, no focal findings or movement disorder noted  PELVIC EXAM EXTERNAL GENITALIA: normal appearing vulva with no masses, tenderness or lesions VAGINA: no abnormal discharge or lesions CERVIX: no lesions or cervical motion tenderness, Pap collected UTERUS: gravid and consistent with 11 weeks ADNEXA: no masses palpable and nontender  ASSESSMENT: Normal pregnancy Declines genetic screening Screening cervical cancer  PLAN: Pap collected Prenatal care New OB counseling: The patient has been given an overview regarding routine prenatal care. Recommendations regarding diet, weight gain, and exercise in pregnancy were given. Prenatal testing, optional genetic testing, and ultrasound use in pregnancy were reviewed.  Benefits of Breast Feeding were discussed. The patient is encouraged to consider nursing her baby post partum. See orders   Gunnar Bulla, CNM [facility], [facility]

## 2018-08-12 NOTE — Progress Notes (Signed)
Patient here for NOB PE, pap ordered, declines genetic testing.

## 2018-08-12 NOTE — Patient Instructions (Addendum)
Morning Sickness Morning sickness is when you feel sick to your stomach (nauseous) during pregnancy. You may feel sick to your stomach and throw up (vomit). You may feel sick in the morning, but you can feel this way any time of day. Some women feel very sick to their stomach and cannot stop throwing up (hyperemesis gravidarum). Follow these instructions at home:  Only take medicines as told by your doctor.  Take multivitamins as told by your doctor. Taking multivitamins before getting pregnant can stop or lessen the harshness of morning sickness.  Eat dry toast or unsalted crackers before getting out of bed.  Eat 5 to 6 small meals a day.  Eat dry and bland foods like rice and baked potatoes.  Do not drink liquids with meals. Drink between meals.  Do not eat greasy, fatty, or spicy foods.  Have someone cook for you if the smell of food causes you to feel sick or throw up.  If you feel sick to your stomach after taking prenatal vitamins, take them at night or with a snack.  Eat protein when you need a snack (nuts, yogurt, cheese).  Eat unsweetened gelatins for dessert.  Wear a bracelet used for sea sickness (acupressure wristband).  Go to a doctor that puts thin needles into certain body points (acupuncture) to improve how you feel.  Do not smoke.  Use a humidifier to keep the air in your house free of odors.  Get lots of fresh air. Contact a doctor if:  You need medicine to feel better.  You feel dizzy or lightheaded.  You are losing weight. Get help right away if:  You feel very sick to your stomach and cannot stop throwing up.  You pass out (faint). This information is not intended to replace advice given to you by your health care provider. Make sure you discuss any questions you have with your health care provider. Document Released: 11/30/2004 Document Revised: 03/30/2016 Document Reviewed: 04/09/2013 Elsevier Interactive Patient Education  2017 [place]. Back Pain in Pregnancy Back pain during pregnancy is common. Back pain may be caused by several factors that are related to changes during your pregnancy. Follow these instructions at home: Managing pain, stiffness, and swelling  If directed, apply ice for sudden (acute) back pain. ? Put ice in a plastic bag. ? Place a towel between your skin and the bag. ? Leave the ice on for 20 minutes, 2-3 times per day.  If directed, apply heat to the affected area before you exercise: ? Place a towel between your skin and the heat pack or heating pad. ? Leave the heat on for 20-30 minutes. ? Remove the heat if your skin turns bright red. This is especially important if you are unable to feel pain, heat, or cold. You may have a greater risk of getting burned. Activity  Exercise as told by your health care provider. Exercising is the best way to prevent or manage back pain.  Listen to your body when lifting. If lifting hurts, ask for help or bend your knees. This uses your leg muscles instead of your back muscles.  Squat down when picking up something from the floor. Do not bend over.  Only use bed rest as told by your health care provider. Bed rest should only be used for the most severe episodes of back pain. Standing, Sitting, and Lying Down  Do not stand in one place for long periods of time.  Use good posture when sitting. Make  sure your head rests over your shoulders and is not hanging forward. Use a pillow on your lower back if necessary.  Try sleeping on your side, preferably the left side, with a pillow or two between your legs. If you are sore after a night's rest, your bed may be too soft. A firm mattress may provide more support for your back during pregnancy. General instructions  Do not wear high heels.  Eat a healthy diet. Try to gain weight within your health care provider's recommendations.  Use a maternity girdle, elastic sling, or back brace as told by your health care  provider.  Take over-the-counter and prescription medicines only as told by your health care provider.  Keep all follow-up visits as told by your health care provider. This is important. This includes any visits with any specialists, such as a physical therapist. Contact a health care provider if:  Your back pain interferes with your daily activities.  You have increasing pain in other parts of your body. Get help right away if:  You develop numbness, tingling, weakness, or problems with the use of your arms or legs.  You develop severe back pain that is not controlled with medicine.  You have a sudden change in bowel or bladder control.  You develop shortness of breath, dizziness, or you faint.  You develop nausea, vomiting, or sweating.  You have back pain that is a rhythmic, cramping pain similar to labor pains. Labor pain is usually 1-2 minutes apart, lasts for about 1 minute, and involves a bearing down feeling or pressure in your pelvis.  You have back pain and your water breaks or you have vaginal bleeding.  You have back pain or numbness that travels down your leg.  Your back pain developed after you fell.  You develop pain on one side of your back.  You see blood in your urine.  You develop skin blisters in the area of your back pain. This information is not intended to replace advice given to you by your health care provider. Make sure you discuss any questions you have with your health care provider. Document Released: 01/31/2006 Document Revised: 03/30/2016 Document Reviewed: 07/07/2015 Elsevier Interactive Patient Education  2018 [place] for Pregnant Women While you are pregnant, your body will require additional nutrition to help support your growing baby. It is recommended that you consume:  150 additional calories each day during your first trimester.  300 additional calories each day during your second trimester.  300 additional  calories each day during your third trimester.  Eating a healthy, well-balanced diet is very important for your health and for your baby's health. You also have a higher need for some vitamins and minerals, such as folic acid, calcium, iron, and vitamin D. What do I need to know about eating during pregnancy?  Do not try to lose weight or go on a diet during pregnancy.  Choose healthy, nutritious foods. Choose  of a sandwich with a glass of milk instead of a candy bar or a high-calorie sugar-sweetened beverage.  Limit your overall intake of foods that have "empty calories." These are foods that have little nutritional value, such as sweets, desserts, candies, sugar-sweetened beverages, and fried foods.  Eat a variety of foods, especially fruits and vegetables.  Take a prenatal vitamin to help meet the additional needs during pregnancy, specifically for folic acid, iron, calcium, and vitamin D.  Remember to stay active. Ask your health care provider for exercise recommendations that  are specific to you.  Practice good food safety and cleanliness, such as washing your hands before you eat and after you prepare raw meat. This helps to prevent foodborne illnesses, such as listeriosis, that can be very dangerous for your baby. Ask your health care provider for more information about listeriosis. What does 150 extra calories look like? Healthy options for an additional 150 calories each day could be any of the following:  Plain low-fat yogurt (6-8 oz) with  cup of berries.  1 apple with 2 teaspoons of peanut butter.  Cut-up vegetables with  cup of hummus.  Low-fat chocolate milk (8 oz or 1 cup).  1 string cheese with 1 medium orange.   of a peanut butter and jelly sandwich on whole-wheat bread (1 tsp of peanut butter).  For 300 calories, you could eat two of those healthy options each day. What is a healthy amount of weight to gain? The recommended amount of weight for you to gain is  based on your pre-pregnancy BMI. If your pre-pregnancy BMI was:  Less than 18 (underweight), you should gain 28-40 lb.  18-24.9 (normal), you should gain 25-35 lb.  25-29.9 (overweight), you should gain 15-25 lb.  Greater than 30 (obese), you should gain 11-20 lb.  What if I am having twins or multiples? Generally, pregnant women who will be having twins or multiples may need to increase their daily calories by 300-600 calories each day. The recommended range for total weight gain is 25-54 lb, depending on your pre-pregnancy BMI. Talk with your health care provider for specific guidance about additional nutritional needs, weight gain, and exercise during your pregnancy. What foods can I eat? Grains Any grains. Try to choose whole grains, such as whole-wheat bread, oatmeal, or brown rice. Vegetables Any vegetables. Try to eat a variety of colors and types of vegetables to get a full range of vitamins and minerals. Remember to wash your vegetables well before eating. Fruits Any fruits. Try to eat a variety of colors and types of fruit to get a full range of vitamins and minerals. Remember to wash your fruits well before eating. Meats and Other Protein Sources Lean meats, including chicken, [place], fish, and lean cuts of beef, veal, or pork. Make sure that all meats are cooked to "well done." Tofu. Tempeh. Beans. Eggs. Peanut butter and other nut butters. Seafood, such as shrimp, crab, and lobster. If you choose fish, select types that are higher in omega-3 fatty acids, including salmon, herring, mussels, trout, sardines, and pollock. Make sure that all meats are cooked to food-safe temperatures. Dairy Pasteurized milk and milk alternatives. Pasteurized yogurt and pasteurized cheese. Cottage cheese. Sour cream. Beverages Water. Juices that contain 100% fruit juice or vegetable juice. Caffeine-free teas and decaffeinated coffee. Drinks that contain caffeine are okay to drink, but it is better to  avoid caffeine. Keep your total caffeine intake to less than 200 mg each day (12 oz of coffee, tea, or soda) or as directed by your health care provider. Condiments Any pasteurized condiments. Sweets and Desserts Any sweets and desserts. Fats and Oils Any fats and oils. The items listed above may not be a complete list of recommended foods or beverages. Contact your dietitian for more options. What foods are not recommended? Vegetables Unpasteurized (raw) vegetable juices. Fruits Unpasteurized (raw) fruit juices. Meats and Other Protein Sources Cured meats that have nitrates, such as bacon, salami, and hotdogs. Luncheon meats, bologna, or other deli meats (unless they are reheated until they are  steaming hot). Refrigerated pate, meat spreads from a meat counter, smoked seafood that is found in the refrigerated section of a store. Raw fish, such as sushi or sashimi. High mercury content fish, such as tilefish, shark, swordfish, and king mackerel. Raw meats, such as tuna or beef tartare. Undercooked meats and poultry. Make sure that all meats are cooked to food-safe temperatures. Dairy Unpasteurized (raw) milk and any foods that have raw milk in them. Soft cheeses, such as feta, queso blanco, queso fresco, Brie, Camembert cheeses, blue-veined cheeses, and Panela cheese (unless it is made with pasteurized milk, which must be stated on the label). Beverages Alcohol. Sugar-sweetened beverages, such as sodas, teas, or energy drinks. Condiments Homemade fermented foods and drinks, such as pickles, sauerkraut, or kombucha drinks. (Store-bought pasteurized versions of these are okay.) Other Salads that are made in the store, such as ham salad, chicken salad, egg salad, tuna salad, and seafood salad. The items listed above may not be a complete list of foods and beverages to avoid. Contact your dietitian for more information. This information is not intended to replace advice given to you by your  health care provider. Make sure you discuss any questions you have with your health care provider. Document Released: 08/07/2014 Document Revised: 03/30/2016 Document Reviewed: 04/07/2014 Elsevier Interactive Patient Education  2018 [employer]. Common Medications Safe in Pregnancy  Acne:      Constipation:  Benzoyl Peroxide     Colace  Clindamycin      Dulcolax Suppository  Topica Erythromycin     Fibercon  Salicylic Acid      Metamucil         Miralax AVOID:        Senakot   Accutane    Cough:  Retin-A       Cough Drops  Tetracycline      Phenergan w/ Codeine if Rx  Minocycline      Robitussin (Plain & DM)  Antibiotics:     Crabs/Lice:  Ceclor       RID  Cephalosporins    AVOID:  E-Mycins      Kwell  Keflex  Macrobid/Macrodantin   Diarrhea:  Penicillin      Kao-Pectate  Zithromax      Imodium AD         PUSH FLUIDS AVOID:       Cipro     Fever:  Tetracycline      Tylenol (Regular or Extra  Minocycline       Strength)  Levaquin      Extra Strength-Do not          Exceed 8 tabs/24 hrs Caffeine:        <262m/day (equiv. To 1 cup of coffee or  approx. 3 12 oz sodas)         Gas: Cold/Hayfever:       Gas-X  Benadryl      Mylicon  Claritin       Phazyme  **Claritin-D        Chlor-Trimeton    Headaches:  Dimetapp      ASA-Free Excedrin  Drixoral-Non-Drowsy     Cold Compress  Mucinex (Guaifenasin)     Tylenol (Regular or Extra  Sudafed/Sudafed-12 Hour     Strength)  **Sudafed PE Pseudoephedrine   Tylenol Cold & Sinus     Vicks Vapor Rub  Zyrtec  **AVOID if Problems With Blood Pressure         Heartburn: Avoid lying down for at least  1 hour after meals  Aciphex      Maalox     Rash:  Milk of Magnesia     Benadryl    Mylanta       1% Hydrocortisone Cream  Pepcid  Pepcid Complete   Sleep Aids:  Prevacid      Ambien   Prilosec       Benadryl  Rolaids       Chamomile Tea  Tums (Limit 4/day)     Unisom  Zantac       Tylenol PM         Warm milk-add vanilla  or  Hemorrhoids:       Sugar for taste  Anusol/Anusol H.C.  (RX: Analapram 2.5%)  Sugar Substitutes:  Hydrocortisone OTC     Ok in moderation  Preparation H      Tucks        Vaseline lotion applied to tissue with wiping    Herpes:     Throat:  Acyclovir      Oragel  Famvir  Valtrex     Vaccines:         Flu Shot Leg Cramps:       *Gardasil  Benadryl      Hepatitis A         Hepatitis B Nasal Spray:       Pneumovax  Saline Nasal Spray     Polio Booster         Tetanus Nausea:       Tuberculosis test or PPD  Vitamin B6 25 mg TID   AVOID:    Dramamine      *Gardasil  Emetrol       Live Poliovirus  Ginger Root 250 mg QID    MMR (measles, mumps &  High Complex Carbs @ Bedtime    rebella)  Sea Bands-Accupressure    Varicella (Chickenpox)  Unisom 1/2 tab TID     *No known complications           If received before Pain:         Known pregnancy;   Darvocet       Resume series after  Lortab        Delivery  Percocet    Yeast:   Tramadol      Femstat  Tylenol 3      Gyne-lotrimin  Ultram       Monistat  Vicodin           MISC:         All Sunscreens           Hair Coloring/highlights          Insect Repellant's          (Including DEET)         Mystic Tans WHAT OB PATIENTS CAN EXPECT   Confirmation of pregnancy and ultrasound ordered if medically indicated-[redacted] weeks gestation  New OB (NOB) intake with nurse and New OB (NOB) labs- [redacted] weeks gestation  New OB (NOB) physical examination with provider- 11/[redacted] weeks gestation  Flu vaccine-[redacted] weeks gestation  Anatomy scan-[redacted] weeks gestation  Glucose tolerance test, blood work to test for anemia, T-dap vaccine-[redacted] weeks gestation  Vaginal swabs/cultures-STD/Group B strep-[redacted] weeks gestation  Appointments every 4 weeks until 28 weeks  Every 2 weeks from 28 weeks until 36 weeks  Weekly visits from 36 weeks until delivery

## 2018-08-13 LAB — CYTOLOGY - PAP: DIAGNOSIS: NEGATIVE

## 2018-09-09 ENCOUNTER — Encounter: Payer: Self-pay | Admitting: Certified Nurse Midwife

## 2018-09-09 ENCOUNTER — Ambulatory Visit (INDEPENDENT_AMBULATORY_CARE_PROVIDER_SITE_OTHER): Payer: BLUE CROSS/BLUE SHIELD | Admitting: Certified Nurse Midwife

## 2018-09-09 VITALS — BP 113/69 | HR 85 | Wt 172.2 lb

## 2018-09-09 DIAGNOSIS — Z3402 Encounter for supervision of normal first pregnancy, second trimester: Secondary | ICD-10-CM | POA: Diagnosis not present

## 2018-09-09 LAB — POCT URINALYSIS DIPSTICK OB
BILIRUBIN UA: NEGATIVE
GLUCOSE, UA: NEGATIVE
Ketones, UA: NEGATIVE
LEUKOCYTES UA: NEGATIVE
Nitrite, UA: NEGATIVE
POC,PROTEIN,UA: NEGATIVE
RBC UA: NEGATIVE
Spec Grav, UA: 1.01 (ref 1.010–1.025)
Urobilinogen, UA: 0.2 E.U./dL
pH, UA: 8 (ref 5.0–8.0)

## 2018-09-09 NOTE — Progress Notes (Signed)
Abigail Parker doing well. Reviewed common discomforts of pregnancy. Reassurance given . Patient states she is planning on transferring out to [facility] birth center and has had then request her records. She state she wants a birth center birth. Verbalized understanding.   Doreene Burke, CNM

## 2018-09-09 NOTE — Patient Instructions (Signed)

## 2018-09-23 ENCOUNTER — Encounter: Payer: Self-pay | Admitting: Certified Nurse Midwife

## 2018-10-15 ENCOUNTER — Telehealth: Payer: Self-pay | Admitting: Obstetrics and Gynecology

## 2018-10-15 ENCOUNTER — Ambulatory Visit (INDEPENDENT_AMBULATORY_CARE_PROVIDER_SITE_OTHER): Payer: BLUE CROSS/BLUE SHIELD

## 2018-10-15 ENCOUNTER — Ambulatory Visit (INDEPENDENT_AMBULATORY_CARE_PROVIDER_SITE_OTHER): Payer: BLUE CROSS/BLUE SHIELD | Admitting: Obstetrics and Gynecology

## 2018-10-15 ENCOUNTER — Telehealth: Payer: Self-pay | Admitting: *Deleted

## 2018-10-15 VITALS — BP 118/71 | HR 83 | Wt 185.9 lb

## 2018-10-15 DIAGNOSIS — Z363 Encounter for antenatal screening for malformations: Secondary | ICD-10-CM

## 2018-10-15 DIAGNOSIS — O26899 Other specified pregnancy related conditions, unspecified trimester: Secondary | ICD-10-CM

## 2018-10-15 DIAGNOSIS — Z3402 Encounter for supervision of normal first pregnancy, second trimester: Secondary | ICD-10-CM

## 2018-10-15 DIAGNOSIS — R102 Pelvic and perineal pain: Principal | ICD-10-CM

## 2018-10-15 DIAGNOSIS — Z3492 Encounter for supervision of normal pregnancy, unspecified, second trimester: Secondary | ICD-10-CM

## 2018-10-15 LAB — POCT URINALYSIS DIPSTICK OB
Bilirubin, UA: NEGATIVE
Glucose, UA: NEGATIVE
KETONES UA: NEGATIVE
Leukocytes, UA: NEGATIVE
NITRITE UA: NEGATIVE
PH UA: 6 (ref 5.0–8.0)
PROTEIN: NEGATIVE
SPEC GRAV UA: 1.01 (ref 1.010–1.025)
UROBILINOGEN UA: 0.2 U/dL

## 2018-10-15 NOTE — Progress Notes (Signed)
ROB- anatomy scan done today, having some sinus pressure

## 2018-10-15 NOTE — Telephone Encounter (Signed)
The patient is [facility] a [facility] for [facility] [place].  She was seen today and states she was told to let Melody know if she wanted this.  Fax number for referral is [phone], please advise, thanks.

## 2018-10-15 NOTE — Progress Notes (Signed)
ROB & anatomy scan: Reviewed normal scan below: Indications:Anatomy Ultrasound Findings:  Singleton intrauterine pregnancy is visualized with FHR at 144 BPM. Biometrics give an (U/S) Gestational age of 3839w5d and an (U/S) EDD of 02/27/19; this correlates with the clinically established Estimated Date of Delivery: 03/01/19  Fetal presentation is Breech.  EFW: wnl. Placenta: anterior. Grade: 0 AFI: subjectively normal.  Anatomic survey is complete and normal; Gender - surprise.   (the patient does not want to know)  Survey of the adnexa demonstrates no adnexal masses. There is no free peritoneal fluid in the cul de sac.  Impression: 1. [redacted]w[redacted]d Viable Singleton Intrauterine pregnancy by U/S. 2. (U/S) EDD is consistent with Clinically established Estimated Date of Delivery: 03/01/19  3. Normal Anatomy Scan

## 2018-10-15 NOTE — Telephone Encounter (Signed)
[facility]

## 2018-10-15 NOTE — Telephone Encounter (Signed)
Pt would like referral to chiropractor

## 2019-05-24 ENCOUNTER — Encounter (HOSPITAL_COMMUNITY): Payer: Self-pay

## 2020-09-03 ENCOUNTER — Ambulatory Visit (INDEPENDENT_AMBULATORY_CARE_PROVIDER_SITE_OTHER): Payer: Medicaid Other | Admitting: Certified Nurse Midwife

## 2020-09-03 ENCOUNTER — Encounter: Payer: Self-pay | Admitting: Certified Nurse Midwife

## 2020-09-03 ENCOUNTER — Other Ambulatory Visit (INDEPENDENT_AMBULATORY_CARE_PROVIDER_SITE_OTHER): Payer: Medicaid Other

## 2020-09-03 ENCOUNTER — Other Ambulatory Visit: Payer: Self-pay

## 2020-09-03 VITALS — BP 115/65 | HR 86 | Ht 70.0 in | Wt 165.3 lb

## 2020-09-03 DIAGNOSIS — O021 Missed abortion: Secondary | ICD-10-CM

## 2020-09-03 NOTE — Progress Notes (Signed)
GYN ENCOUNTER NOTE  Subjective:       Abigail Parker is a 24 y.o. G17P1001 female here for ER follow up. Seen on 08/29/2020 at [facility] for bleeding in early pregnancy and diagnosed with threatened abortion.   Reports vaginal bleeding with clots and passing "gray muscle like" tissue and intermittent abdominal cramping and back pain.   Denies difficulty breathing or respiratory distress, chest pain, dysuria, and leg pain or swelling.    Gynecologic History  Patient's last menstrual period was 06/15/2020.   Contraception: none  Last Pap: 08/2018. Results were: normal  Obstetric History  OB History  Gravida Para Term Preterm AB Living  1 1 1  0 0 1  SAB TAB Ectopic Multiple Live Births  0 0 0 0 1    # Outcome Date GA Lbr Len/2nd Weight Sex Delivery Anes PTL Lv  1 Term 03/13/19 [redacted]w[redacted]d   F Vag-Spont EPI N LIV    Past Medical History:  Diagnosis Date  . Anxiety   . Depression   . Kidney stone   . PTSD (post-traumatic stress disorder)     Past Surgical History:  Procedure Laterality Date  . KNEE SURGERY Left   . WISDOM TOOTH EXTRACTION      Current Outpatient Medications on File Prior to Visit  Medication Sig Dispense Refill  . Prenatal Multivit-Min-Fe-FA (PRE-NATAL FORMULA) TABS Take by mouth.    . ranitidine (ZANTAC) 150 MG tablet Take 1 tablet (150 mg total) by mouth 2 (two) times daily. (Patient not taking: Reported on 09/03/2020) 60 tablet 1   No current facility-administered medications on file prior to visit.    Allergies  Allergen Reactions  . Norco [Hydrocodone-Acetaminophen] Hives  . Norco [Hydrocodone-Acetaminophen] Hives    Social History   Socioeconomic History  . Marital status: Single    Spouse name: Not on file  . Number of children: Not on file  . Years of education: Not on file  . Highest education level: Not on file  Occupational History  . Not on file  Tobacco Use  . Smoking status: Former 09/05/2020  . Smokeless tobacco: Never Used   Vaping Use  . Vaping Use: Never used  Substance and Sexual Activity  . Alcohol use: No  . Drug use: No  . Sexual activity: Yes  Other Topics Concern  . Not on file  Social History Narrative   ** Merged History Encounter **       Social Determinants of Health   Financial Resource Strain:   . Difficulty of Paying Living Expenses: Not on file  Food Insecurity:   . Worried About Games developer in the Last Year: Not on file  . Ran Out of Food in the Last Year: Not on file  Transportation Needs:   . Lack of Transportation (Medical): Not on file  . Lack of Transportation (Non-Medical): Not on file  Physical Activity:   . Days of Exercise per Week: Not on file  . Minutes of Exercise per Session: Not on file  Stress:   . Feeling of Stress : Not on file  Social Connections:   . Frequency of Communication with Friends and Family: Not on file  . Frequency of Social Gatherings with Friends and Family: Not on file  . Attends Religious Services: Not on file  . Active Member of Clubs or Organizations: Not on file  . Attends Programme researcher, broadcasting/film/video Meetings: Not on file  . Marital Status: Not on file  Intimate Partner Violence:   .  Fear of Current or Ex-Partner: Not on file  . Emotionally Abused: Not on file  . Physically Abused: Not on file  . Sexually Abused: Not on file    Family History  Problem Relation Age of Onset  . Hypertension Mother   . Depression Mother   . Anxiety disorder Mother   . Ovarian cancer Paternal Grandmother   . Diabetes Paternal Grandmother   . Breast cancer Paternal Grandmother     The following portions of the patient's history were reviewed and updated as appropriate: allergies, current medications, past family history, past medical history, past social history, past surgical history and problem list.  Review of Systems  ROS negative except as noted above. Information obtained from patient.   Objective:   BP 115/65   Pulse 86   Ht 5\' 10"   (1.778 m)   Wt 165 lb 4.8 oz (75 kg)   LMP 06/15/2020   BMI 23.72 kg/m    CONSTITUTIONAL: Well-developed, well-nourished female in no acute distress.   ULTRASOUND REPORT  Location: [facility] Date of Service: 09/03/2020     Indications:Threatened AB   Findings:  No IUP seen at this time. Endometrium is thickened at 17 mm with mix echogenicities.  Right Ovary is normal in appearance. Left Ovary is normal appearance. Corpus luteal cyst:  is not visualized Survey of the adnexa demonstrates no adnexal masses. There is no free peritoneal fluid in the cul de sac.  Impression: 1. Thickened endometrium as described above. Possible productus of conception..  Recommendations: 1.Clinical correlation with the patient's History and Physical Exam.    Assessment:   1. Missed ab  - Beta hCG quant (ref lab) - 09/05/2020 OB LESS THAN 14 WEEKS WITH OB TRANSVAGINAL; Future     Plan:   Labs today, see orders  Discussed home treatment measures.   Ultrasound findings reviewed with patient, verbalized understanding.   Reviewed red flag symptoms and when to call.   RTC x 1 week for labs.   RTC x 2 weeks for labs and ultrasound or sooner if needed.    [place], CNM [facility], [facility] 09/03/20 7:25 PM

## 2020-09-03 NOTE — Patient Instructions (Signed)
FACTS YOU SHOULD KNOW  About Early Pregnancy Loss  WHAT IS AN EARLY PREGNANCY LOSS? Once the egg is fertilized with the sperm and begins to develop, it attaches to the lining of the uterus. This early pregnancy tissue may not develop into an embryo (the beginning stage of a baby). Sometimes an embryo does develop but does not continue to grow. These problems can be seen on ultrasound.   MANAGEMNT OF EARLY PREGNANCY LOSS: About 4 out of 100 (0.25%) women will have a pregnancy loss in her lifetime.  One in five pregnancies is found to be an early pregnancy loss.  There are 3 ways to care for an early pregnancy loss:   (1) Surgery, (2) Medicine, (3) Waiting for you to pass the pregnancy on your own. The decision as to how to proceed after being diagnosed with and early pregnancy loss is an individual one.  The decision can be made only after appropriate counseling.  You need to weigh the pros and cons of the 3 choices. Then you can make the choice that works for you.  SURGERY (D&E) . Procedure over in 1 day . Requires being put to sleep . Bleeding may be light . Possible problems during surgery, including injury to womb(uterus) . Care provider has more control Medicine (CYTOTEC) . The complete procedure may take days to weeks . No Surgery . Bleeding may be heavy at times . There may be drug side effects . Patient has more control Waiting . You may choose to wait, in which case your own body may complete the passing of the abnormal early pregnancy on its own in about 2-4 weeks . Your bleeding may be heavy at times . There is a small possibility that you may need surgery if the bleeding is too much or not all of the pregnancy has passed.  CYTOTEC MANAGEMENT Prostaglandins (cytotec) are the most widely used drug for this purpose. They cause the uterus to cramp and contract. You will place the medicine yourself inside your vagina in the privacy of your home. Empting of the uterus should occur  within 3 days but the process may continue for several weeks. The bleeding may seem heavy at times.  INSTRUCTIONS: Take all 4 tablets of cytotec (800mcg total) at one time. This will cause a lot of cramping, you may have bleeding, and pass tissue, then the cramping and bleeding should get better. If you do not pass the tissue, then you can take 4 more tablets of cytotec (800mcg total) 48 hours after your first dose.  You will come back to have your blood drawn to make sure the pregnancy hormones are dropping in 1 week. Please call us if you have any questions.   POSSIBLE SIDE EFFECTS FROM CYTOTEC . Nausea  Vomiting . Diarrhea Fever . Chills  Hot Flashes Side effects  from the process of the early pregnancy loss include: . Cramping  Bleeding . Headaches  Dizziness RISKS: This is a low risk procedure. Less than 1 in 100 women has a complication. An incomplete passage of the early pregnancy may occur. Also, hemorrhage (heavy bleeding) could happen.  Rarely the pregnancy will not be passed completely. Excessively heavy bleeding may occur.  Your doctor may need to perform surgery to empty the uterus (D&E). Afterwards: Everybody will feel differently after the early pregnancy loss completion. You may have soreness or cramps for a day or two. You may have soreness or cramps for day or two.  You may have light   bleeding for up to 2 weeks. You may be as active as you feel like being. If you have any of the following problems you may call [facility] [phone] if it is after hours. . If you have pain that does not get better with pain medication . Bleeding that soaks through 2 thick full-sized sanitary pads in an hour . Cramps that last longer than 2 days . Foul smelling discharge . Fever above 100.4 degrees F Even if you do not have any of these symptoms, you should have a follow-up exam to make sure you are healing properly. Your next normal period will usually start again in 4-6 week after  the loss. You can get pregnant soon after the loss, so use birth control right away. Finally: Make sure all your questions are answered before during and after any procedure. Follow up with medical care and family planning methods.     Marland Kitchen

## 2020-09-04 LAB — BETA HCG QUANT (REF LAB): hCG Quant: 5373 m[IU]/mL

## 2020-09-06 ENCOUNTER — Telehealth: Payer: Self-pay

## 2020-09-06 NOTE — Telephone Encounter (Signed)
Pt called in and is requesting additional labs on her 11/4 visit, the pt is requesting full panel of hormones. I told the pt I will send a message and that a nurse will be in contact with her. The pt verbally understood. Please advise

## 2020-09-07 ENCOUNTER — Other Ambulatory Visit: Payer: Self-pay | Admitting: Certified Nurse Midwife

## 2020-09-07 DIAGNOSIS — O021 Missed abortion: Secondary | ICD-10-CM

## 2020-09-07 DIAGNOSIS — Z6740 Type O blood, Rh positive: Secondary | ICD-10-CM

## 2020-09-07 NOTE — Progress Notes (Signed)
Labs ordered.    Serafina Royals, CNM [facility], [facility] 09/07/20 11:42 AM

## 2020-09-09 ENCOUNTER — Other Ambulatory Visit: Payer: Self-pay | Admitting: Certified Nurse Midwife

## 2020-09-09 ENCOUNTER — Other Ambulatory Visit: Payer: Self-pay

## 2020-09-09 ENCOUNTER — Other Ambulatory Visit: Payer: Medicaid Other

## 2020-09-09 DIAGNOSIS — O021 Missed abortion: Secondary | ICD-10-CM

## 2020-09-09 NOTE — Progress Notes (Signed)
Vitamin D and prolactin levels ordered per patient request.    Abigail Parker, CNM [facility], [facility] 09/09/20 9:43 AM

## 2020-09-10 LAB — ANTIBODY SCREEN: Antibody Screen: NEGATIVE

## 2020-09-10 LAB — PROLACTIN: Prolactin: 10.9 ng/mL (ref 4.8–23.3)

## 2020-09-10 LAB — ESTRADIOL: Estradiol: 32.4 pg/mL

## 2020-09-10 LAB — FSH/LH
FSH: 8.7 m[IU]/mL
LH: 2 m[IU]/mL

## 2020-09-10 LAB — BETA HCG QUANT (REF LAB): hCG Quant: 419 m[IU]/mL

## 2020-09-10 LAB — PROGESTERONE: Progesterone: 1.2 ng/mL

## 2020-09-10 LAB — THYROID PANEL WITH TSH
Free Thyroxine Index: 1.9 (ref 1.2–4.9)
T3 Uptake Ratio: 30 % (ref 24–39)
T4, Total: 6.3 ug/dL (ref 4.5–12.0)
TSH: 0.629 u[IU]/mL (ref 0.450–4.500)

## 2020-09-10 LAB — VITAMIN D 25 HYDROXY (VIT D DEFICIENCY, FRACTURES): Vit D, 25-Hydroxy: 28.4 ng/mL — ABNORMAL LOW (ref 30.0–100.0)

## 2020-09-23 ENCOUNTER — Other Ambulatory Visit: Payer: Medicaid Other

## 2020-09-23 ENCOUNTER — Ambulatory Visit (INDEPENDENT_AMBULATORY_CARE_PROVIDER_SITE_OTHER): Payer: Medicaid Other | Admitting: Certified Nurse Midwife

## 2020-09-23 ENCOUNTER — Ambulatory Visit: Payer: Medicaid Other

## 2020-09-23 ENCOUNTER — Other Ambulatory Visit: Payer: Self-pay

## 2020-09-23 ENCOUNTER — Encounter: Payer: Self-pay | Admitting: Certified Nurse Midwife

## 2020-09-23 VITALS — BP 114/59 | HR 80 | Ht 70.0 in | Wt 174.2 lb

## 2020-09-23 DIAGNOSIS — Z8759 Personal history of other complications of pregnancy, childbirth and the puerperium: Secondary | ICD-10-CM | POA: Diagnosis not present

## 2020-09-23 NOTE — Patient Instructions (Signed)
Preventive Care 21-24 Years Old, Female Preventive care refers to visits with your health care provider and lifestyle choices that can promote health and wellness. This includes:  A yearly physical exam. This may also be called an annual well check.  Regular dental visits and eye exams.  Immunizations.  Screening for certain conditions.  Healthy lifestyle choices, such as eating a healthy diet, getting regular exercise, not using drugs or products that contain nicotine and tobacco, and limiting alcohol use. What can I expect for my preventive care visit? Physical exam Your health care provider will check your:  Height and weight. This may be used to calculate body mass index (BMI), which tells if you are at a healthy weight.  Heart rate and blood pressure.  Skin for abnormal spots. Counseling Your health care provider may ask you questions about your:  Alcohol, tobacco, and drug use.  Emotional well-being.  Home and relationship well-being.  Sexual activity.  Eating habits.  Work and work environment.  Method of birth control.  Menstrual cycle.  Pregnancy history. What immunizations do I need?  Influenza (flu) vaccine  This is recommended every year. Tetanus, diphtheria, and pertussis (Tdap) vaccine  You may need a Td booster every 10 years. Varicella (chickenpox) vaccine  You may need this if you have not been vaccinated. Human papillomavirus (HPV) vaccine  If recommended by your health care provider, you may need three doses over 6 months. Measles, mumps, and rubella (MMR) vaccine  You may need at least one dose of MMR. You may also need a second dose. Meningococcal conjugate (MenACWY) vaccine  One dose is recommended if you are age 19-21 years and a first-year college student living in a residence hall, or if you have one of several medical conditions. You may also need additional booster doses. Pneumococcal conjugate (PCV13) vaccine  You may need  this if you have certain conditions and were not previously vaccinated. Pneumococcal polysaccharide (PPSV23) vaccine  You may need one or two doses if you smoke cigarettes or if you have certain conditions. Hepatitis A vaccine  You may need this if you have certain conditions or if you travel or work in places where you may be exposed to hepatitis A. Hepatitis B vaccine  You may need this if you have certain conditions or if you travel or work in places where you may be exposed to hepatitis B. Haemophilus influenzae type b (Hib) vaccine  You may need this if you have certain conditions. You may receive vaccines as individual doses or as more than one vaccine together in one shot (combination vaccines). Talk with your health care provider about the risks and benefits of combination vaccines. What tests do I need?  Blood tests  Lipid and cholesterol levels. These may be checked every 5 years starting at age 20.  Hepatitis C test.  Hepatitis B test. Screening  Diabetes screening. This is done by checking your blood sugar (glucose) after you have not eaten for a while (fasting).  Sexually transmitted disease (STD) testing.  BRCA-related cancer screening. This may be done if you have a family history of breast, ovarian, tubal, or peritoneal cancers.  Pelvic exam and Pap test. This may be done every 3 years starting at age 21. Starting at age 30, this may be done every 5 years if you have a Pap test in combination with an HPV test. Talk with your health care provider about your test results, treatment options, and if necessary, the need for more tests.   Follow these instructions at home: Eating and drinking   Eat a diet that includes fresh fruits and vegetables, whole grains, lean protein, and low-fat dairy.  Take vitamin and mineral supplements as recommended by your health care provider.  Do not drink alcohol if: ? Your health care provider tells you not to drink. ? You are  pregnant, may be pregnant, or are planning to become pregnant.  If you drink alcohol: ? Limit how much you have to 0-1 drink a day. ? Be aware of how much alcohol is in your drink. In the [place] one drink equals one 12 oz bottle of beer (355 mL), one 5 oz glass of wine (148 mL), or one 1 oz glass of hard liquor (44 mL). Lifestyle  Take daily care of your teeth and gums.  Stay active. Exercise for at least 30 minutes on 5 or more days each week.  Do not use any products that contain nicotine or tobacco, such as cigarettes, e-cigarettes, and chewing tobacco. If you need help quitting, ask your health care provider.  If you are sexually active, practice safe sex. Use a condom or other form of birth control (contraception) in order to prevent pregnancy and STIs (sexually transmitted infections). If you plan to become pregnant, see your health care provider for a preconception visit. What's next?  Visit your health care provider once a year for a well check visit.  Ask your health care provider how often you should have your eyes and teeth checked.  Stay up to date on all vaccines. This information is not intended to replace advice given to you by your health care provider. Make sure you discuss any questions you have with your health care provider. Document Revised: 07/04/2018 Document Reviewed: 07/04/2018 Elsevier Patient Education  2020 [employer]  Preparing for Pregnancy If you are considering becoming pregnant, make an appointment to see your regular health care provider to learn how to prepare for a safe and healthy pregnancy (preconception care). During a preconception care visit, your health care provider will:  Do a complete physical exam, including a Pap test.  Take a complete medical history.  Give you information, answer your questions, and help you resolve problems. Preconception checklist Medical history  Tell your health care provider about any current or past  medical conditions. Your pregnancy or your ability to become pregnant may be affected by chronic conditions, such as diabetes, chronic hypertension, and thyroid problems.  Include your family's medical history as well as your partner's medical history.  Tell your health care provider about any history of STIs (sexually transmitted infections).These can affect your pregnancy. In some cases, they can be passed to your baby. Discuss any concerns that you have about STIs.  If indicated, discuss the benefits of genetic testing. This testing will show whether there are any genetic conditions that may be passed from you or your partner to your baby.  Tell your health care provider about: ? Any problems you have had with conception or pregnancy. ? Any medicines you take. These include vitamins, herbal supplements, and over-the-counter medicines. ? Your history of immunizations. Discuss any vaccinations that you may need. Diet  Ask your health care provider what to include in a healthy diet that has a balance of nutrients. This is especially important when you are pregnant or preparing to become pregnant.  Ask your health care provider to help you reach a healthy weight before pregnancy. ? If you are overweight, you may be at higher risk   for certain complications, such as high blood pressure, diabetes, and preterm birth. ? If you are underweight, you are more likely to have a baby who has a low birth weight. Lifestyle, work, and home  Let your health care provider know: ? About any lifestyle habits that you have, such as alcohol use, drug use, or smoking. ? About recreational activities that may put you at risk during pregnancy, such as downhill skiing and certain exercise programs. ? Tell your health care provider about any international travel, especially any travel to places with an active Zika virus outbreak. ? About harmful substances that you may be exposed to at work or at home. These include  chemicals, pesticides, radiation, or even litter boxes. ? If you do not feel safe at home. Mental health  Tell your health care provider about: ? Any history of mental health conditions, including feelings of depression, sadness, or anxiety. ? Any medicines that you take for a mental health condition. These include herbs and supplements. Home instructions to prepare for pregnancy Lifestyle   Eat a balanced diet. This includes fresh fruits and vegetables, whole grains, lean meats, low-fat dairy products, healthy fats, and foods that are high in fiber. Ask to meet with a nutritionist or registered dietitian for assistance with meal planning and goals.  Get regular exercise. Try to be active for at least 30 minutes a day on most days of the week. Ask your health care provider which activities are safe during pregnancy.  Do not use any products that contain nicotine or tobacco, such as cigarettes and e-cigarettes. If you need help quitting, ask your health care provider.  Do not drink alcohol.  Do not take illegal drugs.  Maintain a healthy weight. Ask your health care provider what weight range is right for you. General instructions  Keep an accurate record of your menstrual periods. This makes it easier for your health care provider to determine your baby's due date.  Begin taking prenatal vitamins and folic acid supplements daily as directed by your health care provider.  Manage any chronic conditions, such as high blood pressure and diabetes, as told by your health care provider. This is important. How do I know that I am pregnant? You may be pregnant if you have been sexually active and you miss your period. Symptoms of early pregnancy include:  Mild cramping.  Very light vaginal bleeding (spotting).  Feeling unusually tired.  Nausea and vomiting (morning sickness). If you have any of these symptoms and you suspect that you might be pregnant, you can take a home pregnancy  test. These tests check for a hormone in your urine (human chorionic gonadotropin, or hCG). A woman's body begins to make this hormone during early pregnancy. These tests are very accurate. Wait until at least the first day after you miss your period to take one. If the test shows that you are pregnant (you get a positive result), call your health care provider to make an appointment for prenatal care. What should I do if I become pregnant?      Make an appointment with your health care provider as soon as you suspect you are pregnant.  Do not use any products that contain nicotine, such as cigarettes, chewing tobacco, and e-cigarettes. If you need help quitting, ask your health care provider.  Do not drink alcoholic beverages. Alcohol is related to a number of birth defects.  Avoid toxic odors and chemicals.  You may continue to have sexual intercourse if it   does not cause pain or other problems, such as vaginal bleeding. This information is not intended to replace advice given to you by your health care provider. Make sure you discuss any questions you have with your health care provider. Document Revised: 10/25/2017 Document Reviewed: 05/14/2016 Elsevier Patient Education  2020 [employer] Inc.  

## 2020-09-23 NOTE — Progress Notes (Signed)
GYN ENCOUNTER NOTE  Subjective:       Abigail Parker is a 24 y.o. G2P1001 female here for follow up and repeat labs after miscarriage. Last seen in office on 09/03/2020; for further details, please see previous note.   Doing well. Since last seen had memorial services after pregnancy loss and spent time in [place] with family.   No longer bleeding. Questions ovulation at this time. Last noticed brown spotting on mucus on 09/12/2020.  Denies difficulty breathing or respiratory distress, chest pain, abdominal pain, vaginal bleeding, dysuria, and leg pain or swelling.    Gynecologic History  No LMP recorded.  Contraception: none  Last Pap: 08/2018. Results were: normal  Obstetric History  OB History  Gravida Para Term Preterm AB Living  1 1 1  0 0 1  SAB TAB Ectopic Multiple Live Births  0 0 0 0 1    # Outcome Date GA Lbr Len/2nd Weight Sex Delivery Anes PTL Lv  1 Term 03/13/19 [redacted]w[redacted]d   F Vag-Spont EPI N LIV    Past Medical History:  Diagnosis Date  . Anxiety   . Depression   . Kidney stone   . PTSD (post-traumatic stress disorder)     Past Surgical History:  Procedure Laterality Date  . KNEE SURGERY Left   . WISDOM TOOTH EXTRACTION      Current Outpatient Medications on File Prior to Visit  Medication Sig Dispense Refill  . Multiple Vitamin (MULTIVITAMIN ADULT PO) Take by mouth.    . Prenatal Multivit-Min-Fe-FA (PRE-NATAL FORMULA) TABS Take by mouth.     No current facility-administered medications on file prior to visit.    Allergies  Allergen Reactions  . Norco [Hydrocodone-Acetaminophen] Hives  . Norco [Hydrocodone-Acetaminophen] Hives    Social History   Socioeconomic History  . Marital status: Single    Spouse name: Not on file  . Number of children: Not on file  . Years of education: Not on file  . Highest education level: Not on file  Occupational History  . Not on file  Tobacco Use  . Smoking status: Former [redacted]w[redacted]d  . Smokeless tobacco:  Never Used  Vaping Use  . Vaping Use: Never used  Substance and Sexual Activity  . Alcohol use: No  . Drug use: No  . Sexual activity: Yes  Other Topics Concern  . Not on file  Social History Narrative   ** Merged History Encounter **       Social Determinants of Health   Financial Resource Strain:   . Difficulty of Paying Living Expenses: Not on file  Food Insecurity:   . Worried About Games developer in the Last Year: Not on file  . Ran Out of Food in the Last Year: Not on file  Transportation Needs:   . Lack of Transportation (Medical): Not on file  . Lack of Transportation (Non-Medical): Not on file  Physical Activity:   . Days of Exercise per Week: Not on file  . Minutes of Exercise per Session: Not on file  Stress:   . Feeling of Stress : Not on file  Social Connections:   . Frequency of Communication with Friends and Family: Not on file  . Frequency of Social Gatherings with Friends and Family: Not on file  . Attends Religious Services: Not on file  . Active Member of Clubs or Organizations: Not on file  . Attends Programme researcher, broadcasting/film/video Meetings: Not on file  . Marital Status: Not on file  Intimate  Partner Violence:   . Fear of Current or Ex-Partner: Not on file  . Emotionally Abused: Not on file  . Physically Abused: Not on file  . Sexually Abused: Not on file    Family History  Problem Relation Age of Onset  . Hypertension Mother   . Depression Mother   . Anxiety disorder Mother   . Ovarian cancer Paternal Grandmother   . Diabetes Paternal Grandmother   . Breast cancer Paternal Grandmother     The following portions of the patient's history were reviewed and updated as appropriate: allergies, current medications, past family history, past medical history, past social history, past surgical history and problem list.  Review of Systems  ROS negative except as noted above. Information obtained from patient.   Objective:   BP (!) 114/59   Pulse 80    Ht 5\' 10"  (1.778 m)   Wt 174 lb 3.2 oz (79 kg)   BMI 25.00 kg/m    CONSTITUTIONAL: Well-developed, well-nourished female in no acute distress.   PHYSICAL EXAM: Not indicated.   Recent Results (from the past 2160 hour(s))  Beta hCG quant (ref lab)     Status: None   Collection Time: 09/03/20  4:08 PM  Result Value Ref Range   hCG Quant 5,373 mIU/mL    Comment:                      Female (Non-pregnant)    0 -     5                             (Postmenopausal)  0 -     8                      Female (Pregnant)                      Weeks of Gestation                              3                6 -    71                              4               10 -   750                              5              217 -  7138                              6              158 - 31795                              7             [id number] -[id number]  8            32065 -149571                              9            63803 -151410                             10            46509 -106269                             12            27832 -210612                             14            13950 - 62530                             15            12039 - 70971                             16             9040 - 56451                             17             8175 - 55868                             18             8099 - 58176 Roche E CLIA methodology   VITAMIN D 25 Hydroxy (Vit-D Deficiency, Fractures)     Status: Abnormal   Collection Time: 09/09/20  3:14 PM  Result Value Ref Range   Vit D, 25-Hydroxy 28.4 (L) 30.0 - 100.0 ng/mL    Comment: Vitamin D deficiency has been defined by the Institute of Medicine and an Endocrine Society practice guideline as a level of serum 25-OH vitamin D less than 20 ng/mL (1,2). The Endocrine Society went on to further define vitamin D insufficiency as a level between 21 and 29 ng/mL (2). 1. IOM (Institute of Medicine). 2010. Dietary reference     intakes for calcium and D. [place]: The    [employer]. 2. Holick MF, Binkley [place], Bischoff-Ferrari HA, et al.    Evaluation, treatment, and prevention of vitamin D    deficiency: an Endocrine Society clinical practice    guideline. JCEM. 2011 Jul; 96(7):1911-30.   Prolactin     Status: None   Collection Time: 09/09/20  3:14 PM  Result Value Ref Range   Prolactin 10.9 4.8 - 23.3 ng/mL  Progesterone     Status: None   Collection Time: 09/09/20  3:16 PM  Result Value Ref Range   Progesterone 1.2 ng/mL    Comment:                      Follicular  phase       0.1 -   0.9                      Luteal phase           1.8 -  23.9                      Ovulation phase        0.1 -  12.0                      Pregnant                         First trimester    11.0 -  44.3                         Second trimester   25.4 -  83.3                         Third trimester    58.7 - 214.0                      Postmenopausal         0.0 -   0.1   Estradiol     Status: None   Collection Time: 09/09/20  3:16 PM  Result Value Ref Range   Estradiol 32.4 pg/mL    Comment:                     Adult Female:                       Follicular phase   12.5 -   166.0                       Ovulation phase    85.8 -   498.0                       Luteal phase       43.8 -   211.0                       Postmenopausal     <6.0 -    54.7                     Pregnancy                       1st trimester     215.0 - >4300.0 Roche ECLIA methodology   Antibody screen     Status: None   Collection Time: 09/09/20  3:16 PM  Result Value Ref Range   Antibody Screen Negative Negative  FSH/LH     Status: None   Collection Time: 09/09/20  3:16 PM  Result Value Ref Range   LH 2.0 mIU/mL    Comment:                     Adult Female:                       Follicular phase      2.4 -  12.6  Ovulation phase      14.0 -  95.6                       Luteal phase          1.0 -  11.4                        Postmenopausal        7.7 -  58.5    FSH 8.7 mIU/mL    Comment:                     Adult Female:                       Follicular phase      3.5 -  12.5                       Ovulation phase       4.7 -  21.5                       Luteal phase          1.7 -   7.7                       Postmenopausal       25.8 - 134.8   Thyroid Panel With TSH     Status: None   Collection Time: 09/09/20  3:16 PM  Result Value Ref Range   TSH 0.629 0.450 - 4.500 uIU/mL   T4, Total 6.3 4.5 - 12.0 ug/dL   T3 Uptake Ratio 30 24 - 39 %   Free Thyroxine Index 1.9 1.2 - 4.9  Beta hCG quant (ref lab)     Status: None   Collection Time: 09/09/20  3:19 PM  Result Value Ref Range   hCG Quant 419 mIU/mL    Comment:                      Female (Non-pregnant)    0 -     5                             (Postmenopausal)  0 -     8                      Female (Pregnant)                      Weeks of Gestation                              3                6 -    71                              4               10 -   750                              5  217 -  7138                              6              158 - 31795                              7             [id number] -[id number]                              8            32065 -149571                              9            63803 -151410                             10            46509 -186977                             12            27832 -210612                             14            13950 - 62530                             15            12039 - 70971                             16             9040 - 56451                             17             8175 - 55868                             18             8099 - 58176 Roche E CLIA methodology     Assessment:   1. History of miscarriage  - Progesterone - Beta hCG quant (ref lab)     Plan:   Labs today, see orders. Will contact patient results.   Reviewed red flag  symptoms and when to call.   RTC as needed.    Serafina Royals, CNM [facility], [facility] 09/23/20 4:25 PM

## 2020-09-24 LAB — BETA HCG QUANT (REF LAB): hCG Quant: 13 m[IU]/mL

## 2020-09-24 LAB — PROGESTERONE: Progesterone: 0.7 ng/mL

## 2020-10-18 ENCOUNTER — Other Ambulatory Visit: Payer: Self-pay

## 2020-10-18 ENCOUNTER — Other Ambulatory Visit: Payer: Medicaid Other

## 2020-10-18 ENCOUNTER — Other Ambulatory Visit: Payer: Self-pay | Admitting: Certified Nurse Midwife

## 2020-10-18 DIAGNOSIS — Z8759 Personal history of other complications of pregnancy, childbirth and the puerperium: Secondary | ICD-10-CM

## 2020-10-18 NOTE — Progress Notes (Signed)
Progesterone level ordered to check ovulation per patient request, see chart.    Serafina Royals, CNM [facility], [facility] 10/18/20 1:51 PM

## 2020-10-19 LAB — PROGESTERONE: Progesterone: 6.2 ng/mL

## 2020-10-22 ENCOUNTER — Other Ambulatory Visit: Payer: Medicaid Other

## 2020-10-22 ENCOUNTER — Other Ambulatory Visit: Payer: Self-pay

## 2020-10-22 DIAGNOSIS — Z8759 Personal history of other complications of pregnancy, childbirth and the puerperium: Secondary | ICD-10-CM

## 2020-10-23 LAB — PROGESTERONE: Progesterone: 16.7 ng/mL

## 2022-10-26 ENCOUNTER — Encounter: Payer: Self-pay | Admitting: Emergency Medicine

## 2022-10-26 ENCOUNTER — Emergency Department
Admission: EM | Admit: 2022-10-26 | Discharge: 2022-10-26 | Disposition: A | Discharge status: Home / Self Care | Payer: BLUE CROSS/BLUE SHIELD | Attending: Emergency Medicine | Admitting: Emergency Medicine

## 2022-10-26 ENCOUNTER — Other Ambulatory Visit: Payer: Self-pay

## 2022-10-26 DIAGNOSIS — K529 Noninfective gastroenteritis and colitis, unspecified: Secondary | ICD-10-CM | POA: Insufficient documentation

## 2022-10-26 LAB — URINALYSIS, ROUTINE W REFLEX MICROSCOPIC
Bacteria, UA: NONE SEEN
Bilirubin Urine: NEGATIVE
Glucose, UA: NEGATIVE mg/dL
Hgb urine dipstick: NEGATIVE
Ketones, ur: 20 mg/dL — AB
Leukocytes,Ua: NEGATIVE
Nitrite: NEGATIVE
Protein, ur: 30 mg/dL — AB
Specific Gravity, Urine: 1.032 — ABNORMAL HIGH (ref 1.005–1.030)
pH: 5 (ref 5.0–8.0)

## 2022-10-26 LAB — CBC
HCT: 47 % — ABNORMAL HIGH (ref 36.0–46.0)
Hemoglobin: 15.2 g/dL — ABNORMAL HIGH (ref 12.0–15.0)
MCH: 29.6 pg (ref 26.0–34.0)
MCHC: 32.3 g/dL (ref 30.0–36.0)
MCV: 91.4 fL (ref 80.0–100.0)
Platelets: 314 10*3/uL (ref 150–400)
RBC: 5.14 MIL/uL — ABNORMAL HIGH (ref 3.87–5.11)
RDW: 12.6 % (ref 11.5–15.5)
WBC: 16.3 10*3/uL — ABNORMAL HIGH (ref 4.0–10.5)
nRBC: 0 % (ref 0.0–0.2)

## 2022-10-26 LAB — COMPREHENSIVE METABOLIC PANEL
ALT: 84 U/L — ABNORMAL HIGH (ref 0–44)
AST: 46 U/L — ABNORMAL HIGH (ref 15–41)
Albumin: 4.5 g/dL (ref 3.5–5.0)
Alkaline Phosphatase: 84 U/L (ref 38–126)
Anion gap: 10 (ref 5–15)
BUN: 19 mg/dL (ref 6–20)
CO2: 23 mmol/L (ref 22–32)
Calcium: 9.1 mg/dL (ref 8.9–10.3)
Chloride: 107 mmol/L (ref 98–111)
Creatinine, Ser: 0.55 mg/dL (ref 0.44–1.00)
GFR, Estimated: >60 mL/min (ref >60)
Glucose, Bld: 115 mg/dL — ABNORMAL HIGH (ref 70–99)
Potassium: 4.2 mmol/L (ref 3.5–5.1)
Sodium: 140 mmol/L (ref 135–145)
Total Bilirubin: 1.3 mg/dL — ABNORMAL HIGH (ref 0.3–1.2)
Total Protein: 7.9 g/dL (ref 6.5–8.1)

## 2022-10-26 LAB — PREGNANCY, URINE: Preg Test, Ur: NEGATIVE

## 2022-10-26 LAB — HCG, QUANTITATIVE, PREGNANCY: hCG, Beta Chain, Quant, S: <1 m[IU]/mL (ref <5)

## 2022-10-26 LAB — LIPASE, BLOOD: Lipase: 28 U/L (ref 11–51)

## 2022-10-26 LAB — D-DIMER, QUANTITATIVE: D-Dimer, Quant: 0.29 ug/mL-FEU (ref 0.00–0.50)

## 2022-10-26 MED ORDER — SODIUM CHLORIDE 0.9 % IV BOLUS
1000.0000 mL | Freq: Once | INTRAVENOUS | Status: AC
  Administered 2022-10-26: 1000 mL via INTRAVENOUS

## 2022-10-26 MED ORDER — ONDANSETRON 4 MG PO TBDP: 4.0000 mg | ORAL_TABLET | Freq: Three times a day (TID) | ORAL | 0 refills | Status: AC | PRN

## 2022-10-26 MED ORDER — ONDANSETRON 4 MG PO TBDP
4.0000 mg | ORAL_TABLET | Freq: Once | ORAL | Status: AC | PRN
  Administered 2022-10-26: 4 mg via ORAL
  Filled 2022-10-26: qty 1

## 2022-10-26 NOTE — Discharge Instructions (Addendum)
-  I suspect the likely have a viral gastroenteritis.  Your symptoms should improve within the next 2 to 3 days.  -You may take the ondansetron as needed for nausea/vomiting.  -Follow-up with your primary care provider as needed.  -Return to the emergency department anytime if you begin to experience any new or worsening symptoms.

## 2022-10-26 NOTE — ED Provider Notes (Signed)
[facility] Provider Note    Event Date/Time   First MD Initiated Contact with Patient 10/26/22 1744     (approximate)   History   Chief Complaint Abdominal Pain and Chest Pain   HPI Abigail Parker is a 26 y.o. female, history of anxiety, depression, PTSD, nephrolithiasis, presents to the emergency department for evaluation of nausea/vomiting/diarrhea.  She states that her and her husband both ate [place] last night they had cooked.  Since then, they have both developed nausea/vomiting/diarrhea.  She additionally reports some mild abdominal cramping and chest pain due to the vomiting.  She believes that she may have a stomach bug.  She states that she is predominantly here for IV fluids and nausea medicine.  Denies shortness of breath, flank pain, urinary symptoms, weakness, dizziness/lightheadedness, rash/lesions, numbness or tingling upper or lower extremities, or hematemesis/hematochezia.  History Limitations: No limitations.        Physical Exam  Triage Vital Signs: ED Triage Vitals [10/26/22 1521]  Enc Vitals Group     BP 115/79     Pulse Rate (!) 120     Resp (!) 22     Temp 98.6 F (37 C)     Temp Source Oral     SpO2 98 %     Weight 170 lb (77.1 kg)     Height 5\' 10"  (1.778 m)     Head Circumference      Peak Flow      Pain Score 9     Pain Loc      Pain Edu?      Excl. in GC?     Most recent vital signs: Vitals:   10/26/22 1521  BP: 115/79  Pulse: (!) 120  Resp: (!) 22  Temp: 98.6 F (37 C)  SpO2: 98%    General: Awake, NAD.  Skin: Warm, dry. No rashes or lesions.  Eyes: PERRL. Conjunctivae normal.  CV: Good peripheral perfusion.  Resp: Normal effort.  Abd: Soft, non-tender. No distention.  Neuro: At baseline. No gross neurological deficits.  Musculoskeletal: Normal ROM of all extremities.  Physical Exam    ED Results / Procedures / Treatments  Labs (all labs ordered are listed, but only abnormal results are  displayed) Labs Reviewed  COMPREHENSIVE METABOLIC PANEL - Abnormal; Notable for the following components:      Result Value   Glucose, Bld 115 (*)    AST 46 (*)    ALT 84 (*)    Total Bilirubin 1.3 (*)    All other components within normal limits  CBC - Abnormal; Notable for the following components:   WBC 16.3 (*)    RBC 5.14 (*)    Hemoglobin 15.2 (*)    HCT 47.0 (*)    All other components within normal limits  URINALYSIS, ROUTINE W REFLEX MICROSCOPIC - Abnormal; Notable for the following components:   Color, Urine YELLOW (*)    APPearance HAZY (*)    Specific Gravity, Urine 1.032 (*)    Ketones, ur 20 (*)    Protein, ur 30 (*)    All other components within normal limits  LIPASE, BLOOD  D-DIMER, QUANTITATIVE  PREGNANCY, URINE  HCG, QUANTITATIVE, PREGNANCY  POC URINE PREG, ED     EKG Sinus tachycardia, rate of 120, T wave inversions noted in inferior leads and lateral leads, no significant ST segment changes, normal QRS, QTc be 459.    RADIOLOGY  ED Provider Interpretation: N/A.  No results found.  PROCEDURES:  Critical Care performed: N/A.  Procedures    MEDICATIONS ORDERED IN ED: Medications  ondansetron (ZOFRAN-ODT) disintegrating tablet 4 mg (4 mg Oral Given 10/26/22 1527)  sodium chloride 0.9 % bolus 1,000 mL (1,000 mLs Intravenous New Bag/Given 10/26/22 1755)     IMPRESSION / MDM / ASSESSMENT AND PLAN / ED COURSE  I reviewed the triage vital signs and the nursing notes.                              Differential diagnosis includes, but is not limited to, cystitis, pyelonephritis, gastroenteritis, gastritis, pulmonary embolism, pancreatitis, diverticulitis, cholelithiasis, cholecystitis.  ED Course Patient appears well, vitals within normal limits.  NAD.  CBC shows elevated WBC at 16.3.  No anemia or thrombocytopenia.  CMP shows no electrolyte abnormalities or AKI.  Mild transaminitis present with AST of 46 and ALT of 84.   Lipase  unremarkable at 28.  Assessment/Plan Patient presents with acute onset of nausea/vomiting/diarrhea and abdominal cramping that started earlier today.  She appears well clinically.  No tenderness in the abdomen on physical exam, unlikely benefit from CT imaging.  However, she was notably tachycardic initially.  Treated here with IV fluids and ondansetron which she responded well to.  Her lab workup showed a leukocytosis, but was otherwise unremarkable.   Initial EKG concerning due to T wave inversions, however her D-dimer is negative.  My suspicion for pulmonary embolism or cardiac process at this very low.  I suspect that she likely has a gastroenteritis secondary to recent ingestion of [place].  Her and her husband are experiencing similar symptoms at this time.  She responded well to treatment today.  Provide her with a prescription for ondansetron as needed to help manage her symptoms.  Recommend follow-up with primary care provider as needed.  Will discharge.  Considered admission for this patient, but given her stable presentation and unremarkable workup, she is unlikely benefit from admission.  Provided the patient with anticipatory guidance, return precautions, and educational material. Encouraged the patient to return to the emergency department at any time if they begin to experience any new or worsening symptoms. Patient expressed understanding and agreed with the plan.   Patient's presentation is most consistent with acute complicated illness / injury requiring diagnostic workup.       FINAL CLINICAL IMPRESSION(S) / ED DIAGNOSES   Final diagnoses:  Gastroenteritis     Rx / DC Orders   ED Discharge Orders          Ordered    ondansetron (ZOFRAN-ODT) 4 MG disintegrating tablet  Every 8 hours PRN        10/26/22 1918             Note:  This document was prepared using Dragon voice recognition software and may include unintentional dictation errors.   Varney Daily,  [place] 10/26/22 1924    Dionne Bucy, MD 10/26/22 2337

## 2022-10-26 NOTE — ED Provider Triage Note (Signed)
  Emergency Medicine Provider Triage Evaluation Note  Abigail Parker , a 26 y.o.female,  was evaluated in triage.  Pt complains of nausea/vomiting/abdominal pain.  She states that her and her husband ate some [place] last night that they cook themselves.  However, today they have both been experiencing nausea/vomiting and abdominal cramping.  Additionally reports some chest pain from the vomiting.  She states that she feels like she has food poisoning.  She states that she is predominantly here for IV fluids and nausea medicine.   Review of Systems  Positive: Abdominal pain, chest pain, vomiting, diarrhea Negative: Denies fever, myalgias, paresthesias  Physical Exam   Vitals:   10/26/22 1521  BP: 115/79  Pulse: (!) 120  Resp: (!) 22  Temp: 98.6 F (37 C)  SpO2: 98%   Gen:   Awake, no distress   Resp:  Normal effort  MSK:   Moves extremities without difficulty  Other:    Medical Decision Making  Given the patient's initial medical screening exam, the following diagnostic evaluation has been ordered. The patient will be placed in the appropriate treatment space, once one is available, to complete the evaluation and treatment. I have discussed the plan of care with the patient and I have advised the patient that an ED physician or mid-level practitioner will reevaluate their condition after the test results have been received, as the results may give them additional insight into the type of treatment they may need.    Diagnostics: Labs, UA, EKG  Treatments: none immediately   Varney Daily, [place] 10/26/22 1603

## 2022-10-26 NOTE — ED Triage Notes (Signed)
Patient arrives ambulatory by POV c/o nausea, vomiting, diarrhea and chest pain since this morning. Patient states her husband is having similar symptoms after dinner last night. Patient crying in triage.
